# Patient Record
Sex: Female | Born: 1970
Health system: Southern US, Community
[De-identification: ages and names within clinical notes are randomized; demographics above are authoritative.]

## PROBLEM LIST (undated history)

## (undated) DIAGNOSIS — J45909 Unspecified asthma, uncomplicated: Secondary | ICD-10-CM

## (undated) DIAGNOSIS — E079 Disorder of thyroid, unspecified: Secondary | ICD-10-CM

## (undated) DIAGNOSIS — T7840XA Allergy, unspecified, initial encounter: Secondary | ICD-10-CM

## (undated) HISTORY — DX: Disorder of thyroid, unspecified: E07.9

## (undated) HISTORY — DX: Unspecified asthma, uncomplicated: J45.909

## (undated) HISTORY — DX: Allergy, unspecified, initial encounter: T78.40XA

---

## 2002-05-21 ENCOUNTER — Inpatient Hospital Stay (HOSPITAL_COMMUNITY): Admission: AD | Admit: 2002-05-21 | Discharge: 2002-05-21 | Payer: Self-pay | Admitting: Obstetrics and Gynecology

## 2002-05-21 ENCOUNTER — Encounter: Payer: Self-pay | Admitting: Obstetrics and Gynecology

## 2003-04-07 ENCOUNTER — Inpatient Hospital Stay (HOSPITAL_COMMUNITY): Admission: AD | Admit: 2003-04-07 | Discharge: 2003-04-07 | Payer: Self-pay | Admitting: *Deleted

## 2003-04-30 ENCOUNTER — Other Ambulatory Visit: Admission: RE | Admit: 2003-04-30 | Discharge: 2003-04-30 | Payer: Self-pay | Admitting: Obstetrics and Gynecology

## 2003-11-21 ENCOUNTER — Inpatient Hospital Stay (HOSPITAL_COMMUNITY): Admission: AD | Admit: 2003-11-21 | Discharge: 2003-11-24 | Payer: Self-pay | Admitting: Obstetrics and Gynecology

## 2004-01-01 ENCOUNTER — Other Ambulatory Visit: Admission: RE | Admit: 2004-01-01 | Discharge: 2004-01-01 | Payer: Self-pay | Admitting: Obstetrics and Gynecology

## 2011-07-22 ENCOUNTER — Ambulatory Visit (INDEPENDENT_AMBULATORY_CARE_PROVIDER_SITE_OTHER): Payer: BC Managed Care – PPO | Admitting: Family Medicine

## 2011-07-22 DIAGNOSIS — H1011 Acute atopic conjunctivitis, right eye: Secondary | ICD-10-CM

## 2011-07-22 DIAGNOSIS — J45909 Unspecified asthma, uncomplicated: Secondary | ICD-10-CM

## 2011-07-22 DIAGNOSIS — R059 Cough, unspecified: Secondary | ICD-10-CM

## 2011-07-22 DIAGNOSIS — R05 Cough: Secondary | ICD-10-CM

## 2011-07-22 DIAGNOSIS — R053 Chronic cough: Secondary | ICD-10-CM

## 2011-07-22 MED ORDER — FLUTICASONE PROPIONATE 50 MCG/ACT NA SUSP: 2.0000 | Freq: Every day | NASAL | Status: DC

## 2011-07-22 MED ORDER — ALBUTEROL SULFATE (2.5 MG/3ML) 0.083% IN NEBU: 2.5000 mg | INHALATION_SOLUTION | Freq: Four times a day (QID) | RESPIRATORY_TRACT | Status: DC | PRN

## 2011-07-22 MED ORDER — FLUTICASONE-SALMETEROL 250-50 MCG/DOSE IN AEPB: 1.0000 | INHALATION_SPRAY | Freq: Two times a day (BID) | RESPIRATORY_TRACT | Status: DC

## 2011-07-22 MED ORDER — ALBUTEROL SULFATE (2.5 MG/3ML) 0.083% IN NEBU
2.5000 mg | INHALATION_SOLUTION | Freq: Once | RESPIRATORY_TRACT | Status: AC
  Administered 2011-07-22: 2.5 mg via RESPIRATORY_TRACT

## 2011-07-22 MED ORDER — PREDNISONE 20 MG PO TABS: ORAL_TABLET | ORAL | Status: AC

## 2011-07-22 NOTE — Patient Instructions (Signed)
B?nh Suy?n ? Ng??i L?n (Asthma, Adult) B?nh suy?n l do ???ng d?n khng kh t?i ph?i b? thu h?p. B?nh ny c th? pht sinh do ph?n, b?i, lng th, ??t x?p, m?t s? th?c ph?m, nhi?m trng ???ng h h?p, ti?p xc v?i khi, t?p th? d?c, tnh tr?ng c?ng th?ng tinh th?n ho?c cc ch?t gy d? ?ng khc (nh?ng th? gy ph?n ?ng d? ?ng ho?c d? ?ng). Nh?ng c?n suy?n l?p l?i l ph? bi?n. H??NG D?N CH?M SC T?I NH  U?ng thu?c c k ??n theo yu c?u c?a bc s?.   Trnh ti?p xc v?i ph?n, b?i, lng ??ng v?t, ??t x?p, khi v nh?ng th? khc c th? gy ra nh?ng c?n hen suy?n ? nh v t?i n?i lm vi?c.   B?n c th? t b? nh?ng c?n suy?n h?n n?u b?n gi?m b?t b?i trong nh. My ht b?i khng kh b?ng t?nh ?i?n c th? gip b?n.   Thay g?i ho?c n?m b?ng nh?ng ch?t li?u t gy d? ?ng h?n c th? gip cho b?n.   Bo cho Bc s? bi?t k? ho?ch x? tr c?n suy?n t?i nh bao g?m c? vi?c s? d?ng d?ng c? ?o thng kh t?i ?a gip ?nh gi m?c ?? n?ng c?a c?n suy?n. K? ho?ch x? tr c th? gip gi?m ??n m?c t?i thi?u hay ch?m d?t c?n suy?n m khng c?n nh? ??n s? tr? gip v? y t?.   N?u khng b? h?n ch? s? d?ng n??c, hy u?ng 8 ??n 10 ly n??c m?i ngy.   Lun lun c k? ho?ch chu?n b? nh? ??n s? tr? gip v? y t? bao g?m c? vi?c g?i ?i?n tho?i cho Bc s?, ??n trung tm c?p c?u khu v?c, v g?i ?i?n tho?i s? 911 cho nh?ng c?n suy?n tr?m tr?ng.   Trao ??i v?i bc s? c?a b?n v? sinh ho?t t?p th? d?c ??u ??n m b?n c th? lm.   N?u nguyn nhn gy suy?n l lng th, c th? b?n s? ph?i ng?ng nui ??ng v?t trong nh.  HY THAM V?N V?I CHUYN GIA Y T? N?U:  B?n th? kh kh ho?c th? g?p ngay c? khi ? u?ng thu?c ?? phng ng?a nh?ng c?n suy?n.   B?n b? ?au c?, ?au ng?c ho?c ??m tr? nn ??c l?i.   ??m c?a b?n chuy?n t? mu trong ho?c tr?ng sang vng, xanh, xm ho?c c mu.   B?n c b?t k? tr?c tr?c no c th? lin quan ??n lo?i thu?c b?n ?ang u?ng (v d? nh? pht ban, ng?a, s?ng ho?c kh th?).  HY NGAY L?P T?C THAM V?N V?I CHUYN  GIA Y T? N?U:  Lo?i thu?c b?n th??ng dng khng lm b?n h?t th? kh kh, ho?c b?n ngy cng b? ho ho?c th? g?p nhi?u h?n.   B?n ngy cng kh th?.   B?n b? s?t.  HY CH?C CH?N R?NG B?N:  Hi?u r nh?ng h??ng d?n khi xu?t vi?n.   S? theo di tnh tr?ng b?nh c?a b?n.   S? ??n khm b?nh ngay l?p t?c nh? ? ???c h??ng d?n.  Document Released: 02/27/2005 Document Revised: 02/16/2011 Exit[employer] Patient Information 2012 [employer]. 

## 2011-07-22 NOTE — Progress Notes (Signed)
Subjective: 41 year old female he is female who does not speak much [place]. Her husband is translating for her today. She has a history of asthma and allergic rhinitis over the years. She previously gone to [facility] the last couple of years and has been on Advair but ran out of several weeks ago. The clinic and moved and she was unable to get a refill. She's been wheezing more. Chest nasal congestion and rhinorrhea. Her ears do not bother her. It has not been a problem. She does have chronic cough. She works in a [employer].  Objective: Pleasant lady in no acute distress at this time. TMs are normal. Throat clear. Nose dry congested. Chest had no rhonchi or rales has a soft wheeze diffusely throughout the lungs. Is more pronounced with expiration. Flow is not very good. Peak flow measured to 270 are predicted 400.  Assessment: Asthma Chronic cough Allergic rhinitis   Plan: See what she does nebulizer of albuterol and then decide treatment accordingly thank you  Repeat peak flow 330.

## 2011-07-24 ENCOUNTER — Telehealth: Payer: Self-pay

## 2011-07-24 DIAGNOSIS — R062 Wheezing: Secondary | ICD-10-CM

## 2011-07-24 NOTE — Telephone Encounter (Signed)
Discussed with Dr. Alwyn Ren.  Order should have been for albuterol inhaler.  Since the patient has already purchased the nebulizer solution, nebulizer for home use ordered.

## 2011-07-24 NOTE — Telephone Encounter (Signed)
PATIENT CAME IN WITH A BOX OF ALBUTEROL TO BE USED IN A NEBULIZER, HOWEVER WAS NOT GIVEN A PRESCRIPTION FOR A NEBULIZER MACHINE.  CAN WE WRITE A PRESCRIPTION FOR THIS?

## 2011-07-26 NOTE — Telephone Encounter (Signed)
Advised pt nebulizer order was sent.

## 2012-07-29 ENCOUNTER — Other Ambulatory Visit: Payer: Self-pay | Admitting: Family Medicine

## 2014-10-31 ENCOUNTER — Ambulatory Visit (INDEPENDENT_AMBULATORY_CARE_PROVIDER_SITE_OTHER): Payer: BLUE CROSS/BLUE SHIELD | Admitting: Family Medicine

## 2014-10-31 VITALS — BP 118/68 | HR 92 | Temp 98.3°F | Resp 16 | Ht 59.5 in | Wt 125.2 lb

## 2014-10-31 DIAGNOSIS — J45909 Unspecified asthma, uncomplicated: Secondary | ICD-10-CM

## 2014-10-31 DIAGNOSIS — J45901 Unspecified asthma with (acute) exacerbation: Secondary | ICD-10-CM

## 2014-10-31 DIAGNOSIS — R195 Other fecal abnormalities: Secondary | ICD-10-CM | POA: Diagnosis not present

## 2014-10-31 DIAGNOSIS — N926 Irregular menstruation, unspecified: Secondary | ICD-10-CM

## 2014-10-31 DIAGNOSIS — R062 Wheezing: Secondary | ICD-10-CM | POA: Diagnosis not present

## 2014-10-31 LAB — COMPLETE METABOLIC PANEL WITH GFR
ALT: 70 U/L — ABNORMAL HIGH (ref 6–29)
AST: 39 U/L — ABNORMAL HIGH (ref 10–30)
Albumin: 3.9 g/dL (ref 3.6–5.1)
Alkaline Phosphatase: 78 U/L (ref 33–115)
BUN: 12 mg/dL (ref 7–25)
CO2: 24 mmol/L (ref 20–31)
Calcium: 9.3 mg/dL (ref 8.6–10.2)
Chloride: 104 mmol/L (ref 98–110)
Creat: 0.51 mg/dL (ref 0.50–1.10)
GFR, Est African American: >89 mL/min (ref >=60)
GFR, Est Non African American: >89 mL/min (ref >=60)
Glucose, Bld: 94 mg/dL (ref 65–99)
Potassium: 3.9 mmol/L (ref 3.5–5.3)
Sodium: 138 mmol/L (ref 135–146)
Total Bilirubin: 0.5 mg/dL (ref 0.2–1.2)

## 2014-10-31 LAB — CBC
HCT: 36.5 % (ref 36.0–46.0)
Hemoglobin: 12.3 g/dL (ref 12.0–15.0)
MCH: 26.7 pg (ref 26.0–34.0)
MCHC: 33.7 g/dL (ref 30.0–36.0)
MCV: 79.2 fL (ref 78.0–100.0)
MPV: 8.9 fL (ref 8.6–12.4)
Platelets: 304 10*3/uL (ref 150–400)
RBC: 4.61 MIL/uL (ref 3.87–5.11)
RDW: 12.9 % (ref 11.5–15.5)
WBC: 6.2 10*3/uL (ref 4.0–10.5)

## 2014-10-31 LAB — COMPLETE METABOLIC PANEL WITHOUT GFR: Total Protein: 7 g/dL (ref 6.1–8.1)

## 2014-10-31 LAB — TSH: TSH: <0.008 u[IU]/mL — ABNORMAL LOW (ref 0.350–4.500)

## 2014-10-31 MED ORDER — ALBUTEROL SULFATE (2.5 MG/3ML) 0.083% IN NEBU
2.5000 mg | INHALATION_SOLUTION | Freq: Once | RESPIRATORY_TRACT | Status: AC
  Administered 2014-10-31: 2.5 mg via RESPIRATORY_TRACT

## 2014-10-31 MED ORDER — ALBUTEROL SULFATE HFA 108 (90 BASE) MCG/ACT IN AERS: 2.0000 | INHALATION_SPRAY | Freq: Four times a day (QID) | RESPIRATORY_TRACT | Status: DC | PRN

## 2014-10-31 MED ORDER — IPRATROPIUM BROMIDE 0.02 % IN SOLN
0.5000 mg | Freq: Once | RESPIRATORY_TRACT | Status: AC
  Administered 2014-10-31: 0.5 mg via RESPIRATORY_TRACT

## 2014-10-31 MED ORDER — FLUTICASONE-SALMETEROL 250-50 MCG/DOSE IN AEPB: 1.0000 | INHALATION_SPRAY | Freq: Two times a day (BID) | RESPIRATORY_TRACT | Status: DC

## 2014-10-31 NOTE — Progress Notes (Signed)
Chief Complaint:  Chief Complaint  Patient presents with  . Menstrual Problem    been more irregular periods  . Asthma    for a couple weeks--been increasing SOB  . Medication Refill    Advair  . Stool Concerns    HPI: Margaret Flynn is a 44 y.o. female who reports to [facility] today complaining of asthma sxs worsen without advair , she has allergies to certain perfumes, she is not using the advair regular, she uses it prn. She ahs seasonal allergies and worse in the srping. She has some sxs in the winter. She does use zyrtec during the spring. She has no food allergies. She did not have these sxs in [place], She has ahd these sxs for greater than 6 years. She has no SOb right now but has more wheezing sxs at night.   She has had menstrual irregularities, she used tohave it every month but she has had irregular periods for the last 2 months. No fevers, chill, nausea, vomting, back pain.  She has had 2-3 days pf her cycle. She currently has her cycle. She has not had a pap on a long time. She has never had an abnormal pap. Her oldest siter stopped her  Cycle at 49  She is planning to have a full phsycial in December. No thyroid sxs or   She has some loose stools for the last 2 weeks , 1-2 weeks. She is able to eat and rink ok, she has no nv/abd pain No food saffect her. She doe snot feel abd pain. No new travels and no new medications.  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 No blood in her diarrhea. She is only having it 1-2 time a week.   Past Medical History  Diagnosis Date  . Asthma    Past Surgical History  Procedure Laterality Date  . Cesarean section     Social History   Social History  . Marital Status: Married    Spouse Name: N/A  . Number of Children: N/A  . Years of Education: N/A   Social History Main Topics  . Smoking status: Never Smoker   . Smokeless tobacco: Never Used  . Alcohol Use: None  . Drug Use: None  .  Sexual Activity: Not Asked   Other Topics Concern  . None   Social History Narrative   History reviewed. No pertinent family history. No Known Allergies Prior to Admission medications   Medication Sig Start Date End Date Taking? Authorizing Provider  Fluticasone-Salmeterol (ADVAIR DISKUS) 250-50 MCG/DOSE AEPB Inhale 1 puff into the lungs 2 (two) times daily. PATIENT NEEDS OFFICE VISIT FOR ADDITIONAL REFILLS 07/29/12  Yes Morrell Riddle, PA-C  albuterol (PROVENTIL) (2.5 MG/3ML) 0.083% nebulizer solution Take 3 mLs (2.5 mg total) by nebulization every 6 (six) hours as needed for wheezing. 07/22/11 07/21/12  Peyton Najjar, MD  fluticasone [facility]) 50 MCG/ACT nasal spray Place 2 sprays into the nose daily. 07/22/11 07/21/12  Peyton Najjar, MD  Fluticasone-Salmeterol (ADVAIR) 250-50 MCG/DOSE AEPB Inhale 1 puff into the lungs every 12 (twelve) hours.    Historical Provider, MD     ROS: The patient denies fevers, chills, night sweats, unintentional weight loss, chest pain, palpitations, wheezing, dyspnea on exertion, nausea, vomiting, abdominal pain, dysuria, hematuria, melena, numbness, weakness, or tingling.   All other systems have been reviewed and were otherwise negative with the exception of those mentioned in  the HPI and as above.    PHYSICAL EXAM: Filed Vitals:   10/31/14 1116  BP: 118/68  Pulse: 92  Temp: 98.3 F (36.8 C)  Resp: 16   Body mass index is 24.87 kg/(m^2).   General: Alert, no acute distress HEENT:  Normocephalic, atraumatic, oropharynx patent. EOMI, PERRLA Cardiovascular:  Regular rate and rhythm, no rubs murmurs or gallops.  No Carotid bruits, radial pulse intact. No pedal edema.  Respiratory: Clear to auscultation bilaterally.  No wheezes, rales, or rhonchi.  No cyanosis, no use of accessory musculature Abdominal: No organomegaly, abdomen is soft and non-tender, positive bowel sounds. No masses. Skin: No rashes. Neurologic: Facial musculature  symmetric. Psychiatric: Patient acts appropriately throughout our interaction. Lymphatic: No cervical or submandibular lymphadenopathy, no thryodimegaly Musculoskeletal: Gait intact. No edema, tenderness GU-normal cervix, no masses lesion, she is on menses   LABS: No results found for this or any previous visit.   EKG/XRAY:   Primary read interpreted by Dr. Conley Rolls at [facility].   ASSESSMENT/PLAN: Encounter Diagnoses  Name Primary?  . Loose stools Yes  . Irregular periods   . Asthma with acute exacerbation, unspecified asthma severity   . Wheezing    Felt better after 1 neb treatment She is probably going through premenopauseal sxs Pap and labs pending Refill Advair Rx Albuterol prn Cont with zyrtec Monitor stool sxs  Gross sideeffects, risk and benefits, and alternatives of medications d/w patient. Patient is aware that all medications have potential sideeffects and we are unable to predict every sideeffect or drug-drug interaction that may occur.  Thao Le DO  10/31/2014 11:57 AM

## 2014-11-03 LAB — PAP IG, CT-NG, RFX HPV ASCU
Chlamydia Probe Amp: NEGATIVE
GC Probe Amp: NEGATIVE

## 2014-11-09 ENCOUNTER — Telehealth: Payer: Self-pay | Admitting: Family Medicine

## 2014-11-09 NOTE — Telephone Encounter (Signed)
LM to call me back about labs, will send leter as well. She will be referred to endocrinology.

## 2014-11-13 ENCOUNTER — Telehealth: Payer: Self-pay | Admitting: Family Medicine

## 2014-11-13 ENCOUNTER — Encounter: Payer: Self-pay | Admitting: Family Medicine

## 2014-11-13 DIAGNOSIS — R7989 Other specified abnormal findings of blood chemistry: Secondary | ICD-10-CM

## 2014-11-13 NOTE — Telephone Encounter (Signed)
LM to advise to return to office for repeat liver enzymes and hepatitis panel , also complete thyroid panel. I have already referred her to endo

## 2014-11-28 ENCOUNTER — Ambulatory Visit (INDEPENDENT_AMBULATORY_CARE_PROVIDER_SITE_OTHER): Payer: BLUE CROSS/BLUE SHIELD | Admitting: Physician Assistant

## 2014-11-28 VITALS — BP 130/70 | HR 94 | Temp 98.0°F | Resp 18 | Ht 59.0 in | Wt 124.2 lb

## 2014-11-28 DIAGNOSIS — R946 Abnormal results of thyroid function studies: Secondary | ICD-10-CM

## 2014-11-28 DIAGNOSIS — R7989 Other specified abnormal findings of blood chemistry: Secondary | ICD-10-CM

## 2014-11-28 DIAGNOSIS — E0590 Thyrotoxicosis, unspecified without thyrotoxic crisis or storm: Secondary | ICD-10-CM | POA: Diagnosis not present

## 2014-11-28 DIAGNOSIS — R748 Abnormal levels of other serum enzymes: Secondary | ICD-10-CM | POA: Diagnosis not present

## 2014-11-28 LAB — COMPREHENSIVE METABOLIC PANEL
ALT: 47 U/L — AB (ref 6–29)
AST: 33 U/L — AB (ref 10–30)
Albumin: 4.1 g/dL (ref 3.6–5.1)
Alkaline Phosphatase: 79 U/L (ref 33–115)
BILIRUBIN TOTAL: 0.5 mg/dL (ref 0.2–1.2)
BUN: 17 mg/dL (ref 7–25)
CO2: 25 mmol/L (ref 20–31)
CREATININE: 0.52 mg/dL (ref 0.50–1.10)
Calcium: 9.6 mg/dL (ref 8.6–10.2)
Chloride: 103 mmol/L (ref 98–110)
GLUCOSE: 118 mg/dL — AB (ref 65–99)
Potassium: 4 mmol/L (ref 3.5–5.3)
SODIUM: 141 mmol/L (ref 135–146)
Total Protein: 7.4 g/dL (ref 6.1–8.1)

## 2014-11-28 LAB — TSH: TSH: <0.008 u[IU]/mL — ABNORMAL LOW (ref 0.350–4.500)

## 2014-11-28 LAB — T3, FREE: T3 FREE: 9 pg/mL — AB (ref 2.3–4.2)

## 2014-11-28 LAB — HEPATITIS B SURFACE ANTIGEN: Hepatitis B Surface Ag: NEGATIVE

## 2014-11-28 LAB — T4, FREE: Free T4: 2.43 ng/dL — ABNORMAL HIGH (ref 0.80–1.80)

## 2014-11-28 LAB — HEPATITIS B CORE ANTIBODY, IGM: HEP B C IGM: NONREACTIVE

## 2014-11-28 LAB — HEPATITIS B SURFACE ANTIBODY, QUANTITATIVE: Hepatitis B-Post: 0 m[IU]/mL

## 2014-11-28 LAB — HEPATITIS C ANTIBODY: HCV AB: NEGATIVE

## 2014-11-28 NOTE — Patient Instructions (Signed)
We rechecked several labs today to look at your liver and your thyroid.  We will let you know these results. It looks like your endocrinology appointment is already scheduled: Dr Talmage Nap at [facility]. 12/24/14 at 9:00am [address] [place], [place] [address] [phone]   Hyperthyroidism The thyroid is a large gland located in the lower front part of your neck. The thyroid helps control metabolism. Metabolism is how your body uses food. It controls metabolism with the hormone thyroxine. When the thyroid is overactive, it produces too much hormone. When this happens, these following problems may occur:   Nervousness  Heat intolerance  Weight loss (in spite of increase food intake)  Diarrhea  Change in hair or skin texture  Palpitations (heart skipping or having extra beats)  Tachycardia (rapid heart rate)  Loss of menstruation (amenorrhea)  Shaking of the hands CAUSES  Grave's Disease (the immune system attacks the thyroid gland). This is the most common cause.  Inflammation of the thyroid gland.  Tumor (usually benign) in the thyroid gland or elsewhere.  Excessive use of thyroid medications (both prescription and 'natural').  Excessive ingestion of Iodine. DIAGNOSIS  To prove hyperthyroidism, your caregiver may do blood tests and ultrasound tests. Sometimes the signs are hidden. It may be necessary for your caregiver to watch this illness with blood tests, either before or after diagnosis and treatment. TREATMENT Short-term treatment There are several treatments to control symptoms. Drugs called beta blockers may give some relief. Drugs that decrease hormone production will provide temporary relief in many people. These measures will usually not give permanent relief. Definitive therapy There are treatments available which can be discussed between you and your caregiver which will permanently treat the problem. These treatments range from surgery  (removal of the thyroid), to the use of radioactive iodine (destroys the thyroid by radiation), to the use of antithyroid drugs (interfere with hormone synthesis). The first two treatments are permanent and usually successful. They most often require hormone replacement therapy for life. This is because it is impossible to remove or destroy the exact amount of thyroid required to make a person euthyroid (normal). HOME CARE INSTRUCTIONS  See your caregiver if the problems you are being treated for get worse. Examples of this would be the problems listed above. SEEK MEDICAL CARE IF: Your general condition worsens. MAKE SURE YOU:   Understand these instructions.  Will watch your condition.  Will get help right away if you are not doing well or get worse. Document Released: 02/27/2005 Document Revised: 05/22/2011 Document Reviewed: 07/11/2006 [facility] Patient Information 2015 [place], [place]. This information is not intended to replace advice given to you by your health care provider. Make sure you discuss any questions you have with your health care provider.

## 2014-11-28 NOTE — Progress Notes (Signed)
   Subjective:    Patient ID: Margaret Flynn, female    DOB: 01-Oct-1970, 44 y.o.   MRN: [id number]  Chief Complaint  Patient presents with  . Follow-up    recheck labs (received letter)   Medications, allergies, past medical history, surgical history, family history, social history and problem list reviewed and updated.  HPI  44 yof presents for recheck labs.  Seen here last month for loose stools. Labs at that time showed elevated lfts and low tsh. Returns today for recheck. Denies diarrhea, fevers, chills, abd pain, RUQ pain, weight gain/loss, trouble sleeping.   Review of Systems See HPI     Objective:   Physical Exam  Constitutional: She is oriented to person, place, and time. She appears well-developed and well-nourished.  Non-toxic appearance. She does not have a sickly appearance. She does not appear ill. No distress.  BP 130/70 mmHg  Pulse 94  Temp(Src) 98 F (36.7 C) (Oral)  Resp 18  Ht  (1.499 m)  Wt 124 lb 4 oz (56.359 kg)  BMI 25.08 kg/m2  SpO2 98%  LMP 10/28/2014   Neurological: She is alert and oriented to person, place, and time.      Assessment & Plan:   Elevated liver enzymes - Plan: Comprehensive metabolic panel, Hepatitis B surface antibody, Hepatitis B surface antigen, Hepatitis C antibody, Hepatitis B core antibody, IgM  Abnormal thyroid blood test - Plan: TSH, T4, Free, T3, Free  Hyperthyroidism --rechecking labs today, will f/u results --pt has already been referred to dr balan next month for possible hyperthyroidism  Donnajean Lopes, PA-C Physician Assistant-Certified [facility]  11/28/2014 3:48 PM

## 2014-12-21 ENCOUNTER — Encounter: Payer: Self-pay | Admitting: Family Medicine

## 2015-01-20 ENCOUNTER — Other Ambulatory Visit (HOSPITAL_COMMUNITY): Payer: Self-pay | Admitting: Endocrinology

## 2015-01-20 DIAGNOSIS — E0590 Thyrotoxicosis, unspecified without thyrotoxic crisis or storm: Secondary | ICD-10-CM

## 2015-01-25 ENCOUNTER — Encounter (HOSPITAL_COMMUNITY)
Admission: RE | Admit: 2015-01-25 | Discharge: 2015-01-25 | Disposition: A | Discharge status: Home / Self Care | Payer: BLUE CROSS/BLUE SHIELD | Source: Ambulatory Visit | Attending: Endocrinology | Admitting: Endocrinology

## 2015-01-25 ENCOUNTER — Other Ambulatory Visit (HOSPITAL_COMMUNITY): Payer: Self-pay | Admitting: Endocrinology

## 2015-01-25 DIAGNOSIS — E0590 Thyrotoxicosis, unspecified without thyrotoxic crisis or storm: Secondary | ICD-10-CM

## 2015-01-26 ENCOUNTER — Encounter (HOSPITAL_COMMUNITY)
Admission: RE | Admit: 2015-01-26 | Discharge: 2015-01-26 | Disposition: A | Discharge status: Home / Self Care | Payer: BLUE CROSS/BLUE SHIELD | Source: Ambulatory Visit | Attending: Endocrinology | Admitting: Endocrinology

## 2015-01-26 DIAGNOSIS — E0590 Thyrotoxicosis, unspecified without thyrotoxic crisis or storm: Secondary | ICD-10-CM

## 2015-01-26 MED ORDER — SODIUM PERTECHNETATE TC 99M INJECTION: 10.0000 | Freq: Once | INTRAVENOUS | Status: DC | PRN

## 2015-01-28 ENCOUNTER — Other Ambulatory Visit (HOSPITAL_COMMUNITY): Payer: Self-pay | Admitting: Endocrinology

## 2015-01-28 DIAGNOSIS — E0590 Thyrotoxicosis, unspecified without thyrotoxic crisis or storm: Secondary | ICD-10-CM

## 2015-02-12 ENCOUNTER — Encounter (HOSPITAL_COMMUNITY)
Admission: RE | Admit: 2015-02-12 | Discharge: 2015-02-12 | Disposition: A | Discharge status: Home / Self Care | Payer: BLUE CROSS/BLUE SHIELD | Source: Ambulatory Visit | Attending: Endocrinology | Admitting: Endocrinology

## 2015-02-12 DIAGNOSIS — E0590 Thyrotoxicosis, unspecified without thyrotoxic crisis or storm: Secondary | ICD-10-CM

## 2015-02-12 LAB — HCG, SERUM, QUALITATIVE: Preg, Serum: NEGATIVE

## 2015-02-12 MED ORDER — SODIUM IODIDE I 131 CAPSULE: 14.0000 | Freq: Once | INTRAVENOUS | Status: DC | PRN

## 2015-02-18 ENCOUNTER — Encounter: Payer: Self-pay | Admitting: Family Medicine

## 2015-02-19 ENCOUNTER — Encounter: Payer: Self-pay | Admitting: Family Medicine

## 2015-07-29 ENCOUNTER — Encounter: Payer: BLUE CROSS/BLUE SHIELD | Admitting: Family Medicine

## 2015-11-01 LAB — TSH: TSH: 0.22 u[IU]/mL — AB (ref <=5.90)

## 2015-11-04 ENCOUNTER — Encounter: Payer: BLUE CROSS/BLUE SHIELD | Admitting: Family Medicine

## 2015-11-10 DIAGNOSIS — J45909 Unspecified asthma, uncomplicated: Secondary | ICD-10-CM | POA: Insufficient documentation

## 2015-11-10 NOTE — Progress Notes (Signed)
Subjective:    Patient ID: Margaret Flynn, female    DOB: 18-Sep-1970, 45 y.o.   MRN: [id number] Chief Complaint  Patient presents with  . Annual Exam    CPE    HPI  Margaret Flynn is a 45 yo woman here today for a wellness exam.  This is my first time meeting Margaret Flynn.  [facility] 1 yr prior and she has not had a complete physical in [facility] prior.  Primary prevention screening: Cervical cancer: Had nml pap smear 1 yr prior, HR HPV screen not done. No h/o abnml pap prior. Breast cancer:  Thinks she has had one prior but not sure.  Is reluctant to proceed with mammogram but ultimately willing.  No family history. Cardiovascular risk factors: No lipid panel prior. Vitamins/Supplements/OTC meds: None. Immunizations:  Does not remember ever having a tetanus shot prior but is willing to do today.  Does get her flu shot every year at her job. Diet/Exercise: Has started trying to walk.  Tries to eat healthy. Dentist: does go occ Optho: Has glasses but has not been in several years.   Chronic medical conditions:  Asthma: On Advair, worsened last yr when tried off.  Exacerbation with seasonal allergies and strong odors.  Hyperthyroidism: Followed by Dr. Talmage NapBalan, has RAI therapy this past Dev (9 mos prior).  Elevated LFTs:  Tested neg for Hep B and Hep C.  Is NOT hep B immune.  She declines starting the vaccination series today.  C/o of an itchy rash above her ankles that is worse when she is outside walking for the past mo - has tried an unknown otc product w/o relief.  Has been keeping well moisturized  Past Medical History:  Diagnosis Date  . Asthma    Past Surgical History:  Procedure Laterality Date  . CESAREAN SECTION     Current Outpatient Prescriptions on File Prior to Visit  Medication Sig Dispense Refill  . albuterol (PROVENTIL HFA;VENTOLIN HFA) 108 (90 BASE) MCG/ACT inhaler Inhale 2 puffs into the lungs every 6 (six) hours as needed for wheezing or shortness of breath.  (Patient not taking: Reported on 11/11/2015) 1 Inhaler 11   No current facility-administered medications on file prior to visit.    No Known Allergies No family history on file. Social History   Social History  . Marital status: Married    Spouse name: N/A  . Number of children: N/A  . Years of education: N/A   Social History Main Topics  . Smoking status: Never Smoker  . Smokeless tobacco: Never Used  . Alcohol use None  . Drug use: Unknown  . Sexual activity: Not Asked   Other Topics Concern  . None   Social History Narrative  . None    [facility] 11/11/2015 10/31/2014 10/31/2014  Decreased Interest 0 0 0  Down, Depressed, Hopeless 0 0 0  PHQ - 2 Score 0 0 0     Review of Systems  All other systems reviewed and are negative.      Objective:   Physical Exam  Constitutional: She is oriented to person, place, and time. She appears well-developed and well-nourished. No distress.  HENT:  Head: Normocephalic and atraumatic.  Right Ear: Tympanic membrane, external ear and ear canal normal.  Left Ear: Tympanic membrane, external ear and ear canal normal.  Nose: Mucosal edema present. No rhinorrhea.  Mouth/Throat: Uvula is midline, oropharynx is clear and moist and mucous membranes are normal.  No posterior oropharyngeal erythema.  Eyes: Conjunctivae and EOM are normal. Pupils are equal, round, and reactive to light. Right eye exhibits no discharge. Left eye exhibits no discharge. No scleral icterus.  Neck: Normal range of motion. Neck supple. No thyromegaly present.  Cardiovascular: Normal rate, regular rhythm, normal heart sounds and intact distal pulses.   Pulmonary/Chest: Effort normal and breath sounds normal. No respiratory distress.  Abdominal: Soft. Bowel sounds are normal. There is no tenderness.  Genitourinary: Vagina normal and uterus normal. No breast swelling, tenderness, discharge or bleeding. Cervix exhibits no motion tenderness and no  friability. Right adnexum displays no mass and no tenderness. Left adnexum displays no mass and no tenderness.  Musculoskeletal: She exhibits no edema.  Lymphadenopathy:    She has no cervical adenopathy.  Neurological: She is alert and oriented to person, place, and time. She has normal reflexes.  Skin: Skin is warm and dry. She is not diaphoretic. No erythema.  Psychiatric: She has a normal mood and affect. Her behavior is normal.     BP 110/70   Pulse 79   Temp 97.9 F (36.6 C) (Oral)   Resp 18   Ht 4\' 11"  (1.499 m)   Wt 120 lb 3.2 oz (54.5 kg)   LMP 09/14/2015   SpO2 100%   BMI 24.28 kg/m        Visual Acuity Screening   Right eye Left eye Both eyes  Without correction: 20/100 20/70 20/70  With correction:        Assessment & Plan:  Needs imm: tdap done today, flu shot annually at work, hep B Does not need pap until 10/2017 but repeated today as she has NOT had many reg pap smears thoughout life though no h/o abnml Referred for mammogram - pt reluctant but begrudgingly agrees  1. Annual physical exam   2. Routine screening for STI (sexually transmitted infection)   3. Screening for breast cancer   4. Screening for cardiovascular, respiratory, and genitourinary diseases   5. Screening for cervical cancer   6. Screening for deficiency anemia   7. Hyperthyroidism   8. Asthma, unspecified asthma severity, uncomplicated   9. Elevated LFTs   10. Need for prophylactic vaccination with combined diphtheria-tetanus-pertussis (DTP) vaccine   11. Contact dermatitis    Reminded pt to go to eye doctor - def needs glasses - likely causing HAs  Orders Placed This Encounter  Procedures  . MM Digital Screening    Standing Status:   Future    Standing Expiration Date:   01/09/2017    Order Specific Question:   Reason for Exam (SYMPTOM  OR DIAGNOSIS REQUIRED)    Answer:   screening    Order Specific Question:   Is the patient pregnant?    Answer:   No    Order Specific  Question:   Preferred imaging location?    Answer:   [facility]  . Tdap vaccine greater than or equal to 7yo IM  . HIV antibody  . Comprehensive metabolic panel    Order Specific Question:   Has the patient fasted?    Answer:   Yes  . CBC  . Lipid panel    Order Specific Question:   Has the patient fasted?    Answer:   Yes  . Care order/instruction:    AVS - please print after vaccine is given and GO!  Marland Kitchen POCT urinalysis dipstick    Meds ordered this encounter  Medications  . levothyroxine (SYNTHROID, LEVOTHROID)  75 MCG tablet    Sig: Take 75 mcg by mouth daily before breakfast.  . Fluticasone-Salmeterol (ADVAIR DISKUS) 250-50 MCG/DOSE AEPB    Sig: Inhale 1 puff into the lungs 2 (two) times daily. Gargle with water and spit after each inhalation    Dispense:  1 each    Refill:  12    Please hold on file for whenever pt needs it.  . triamcinolone cream (KENALOG) 0.1 %    Sig: Apply 1 application topically 2 (two) times daily as needed. For itching allergic rash    Dispense:  80 g    Refill:  0     Norberto Sorenson, M.D.  [facility] [address] [place], [place] [address] [phone] phone [phone] fax  11/15/15 10:25 PM

## 2015-11-11 ENCOUNTER — Ambulatory Visit (INDEPENDENT_AMBULATORY_CARE_PROVIDER_SITE_OTHER): Payer: BLUE CROSS/BLUE SHIELD | Admitting: Family Medicine

## 2015-11-11 ENCOUNTER — Encounter: Payer: Self-pay | Admitting: Family Medicine

## 2015-11-11 VITALS — BP 110/70 | HR 79 | Temp 97.9°F | Resp 18 | Ht 59.0 in | Wt 120.2 lb

## 2015-11-11 DIAGNOSIS — Z1239 Encounter for other screening for malignant neoplasm of breast: Secondary | ICD-10-CM | POA: Diagnosis not present

## 2015-11-11 DIAGNOSIS — E0590 Thyrotoxicosis, unspecified without thyrotoxic crisis or storm: Secondary | ICD-10-CM

## 2015-11-11 DIAGNOSIS — Z0000 Encounter for general adult medical examination without abnormal findings: Secondary | ICD-10-CM | POA: Diagnosis not present

## 2015-11-11 DIAGNOSIS — Z130 Encounter for screening for diseases of the blood and blood-forming organs and certain disorders involving the immune mechanism: Secondary | ICD-10-CM

## 2015-11-11 DIAGNOSIS — Z1383 Encounter for screening for respiratory disorder NEC: Secondary | ICD-10-CM

## 2015-11-11 DIAGNOSIS — Z136 Encounter for screening for cardiovascular disorders: Secondary | ICD-10-CM

## 2015-11-11 DIAGNOSIS — Z1389 Encounter for screening for other disorder: Secondary | ICD-10-CM

## 2015-11-11 DIAGNOSIS — Z23 Encounter for immunization: Secondary | ICD-10-CM | POA: Diagnosis not present

## 2015-11-11 DIAGNOSIS — Z124 Encounter for screening for malignant neoplasm of cervix: Secondary | ICD-10-CM

## 2015-11-11 DIAGNOSIS — L259 Unspecified contact dermatitis, unspecified cause: Secondary | ICD-10-CM | POA: Diagnosis not present

## 2015-11-11 DIAGNOSIS — R945 Abnormal results of liver function studies: Secondary | ICD-10-CM

## 2015-11-11 DIAGNOSIS — R7989 Other specified abnormal findings of blood chemistry: Secondary | ICD-10-CM

## 2015-11-11 DIAGNOSIS — Z113 Encounter for screening for infections with a predominantly sexual mode of transmission: Secondary | ICD-10-CM

## 2015-11-11 DIAGNOSIS — J45909 Unspecified asthma, uncomplicated: Secondary | ICD-10-CM

## 2015-11-11 LAB — POCT URINALYSIS DIP (MANUAL ENTRY)
Bilirubin, UA: NEGATIVE
GLUCOSE UA: NEGATIVE
Ketones, POC UA: NEGATIVE
Leukocytes, UA: NEGATIVE
NITRITE UA: NEGATIVE
Protein Ur, POC: NEGATIVE
Spec Grav, UA: 1.01
UROBILINOGEN UA: 0.2
pH, UA: 7

## 2015-11-11 LAB — COMPREHENSIVE METABOLIC PANEL
ALBUMIN: 4.3 g/dL (ref 3.6–5.1)
ALT: 20 U/L (ref 6–29)
AST: 19 U/L (ref 10–35)
Alkaline Phosphatase: 101 U/L (ref 33–115)
BILIRUBIN TOTAL: 0.6 mg/dL (ref 0.2–1.2)
BUN: 12 mg/dL (ref 7–25)
CO2: 29 mmol/L (ref 20–31)
CREATININE: 0.63 mg/dL (ref 0.50–1.10)
Calcium: 9.7 mg/dL (ref 8.6–10.2)
Chloride: 103 mmol/L (ref 98–110)
Glucose, Bld: 93 mg/dL (ref 65–99)
Potassium: 5.1 mmol/L (ref 3.5–5.3)
SODIUM: 139 mmol/L (ref 135–146)
TOTAL PROTEIN: 7.9 g/dL (ref 6.1–8.1)

## 2015-11-11 LAB — LIPID PANEL
Cholesterol: 221 mg/dL — ABNORMAL HIGH (ref 125–200)
HDL: 79 mg/dL (ref >=46)
LDL Cholesterol: 122 mg/dL (ref <130)
Total CHOL/HDL Ratio: 2.8 Ratio (ref <=5.0)
Triglycerides: 100 mg/dL (ref <150)
VLDL: 20 mg/dL (ref <30)

## 2015-11-11 LAB — CBC
HCT: 38.7 % (ref 35.0–45.0)
Hemoglobin: 12.8 g/dL (ref 11.7–15.5)
MCH: 27.4 pg (ref 27.0–33.0)
MCHC: 33.1 g/dL (ref 32.0–36.0)
MCV: 82.9 fL (ref 80.0–100.0)
MPV: 8.6 fL (ref 7.5–12.5)
PLATELETS: 285 10*3/uL (ref 140–400)
RBC: 4.67 MIL/uL (ref 3.80–5.10)
RDW: 13.4 % (ref 11.0–15.0)
WBC: 8.1 10*3/uL (ref 3.8–10.8)

## 2015-11-11 LAB — HIV ANTIBODY (ROUTINE TESTING W REFLEX): HIV 1&2 Ab, 4th Generation: NONREACTIVE

## 2015-11-11 MED ORDER — TRIAMCINOLONE ACETONIDE 0.1 % EX CREA: 1.0000 "application " | TOPICAL_CREAM | Freq: Two times a day (BID) | CUTANEOUS | 0 refills | Status: DC | PRN

## 2015-11-11 MED ORDER — FLUTICASONE-SALMETEROL 250-50 MCG/DOSE IN AEPB: 1.0000 | INHALATION_SPRAY | Freq: Two times a day (BID) | RESPIRATORY_TRACT | 12 refills | Status: DC

## 2015-11-11 NOTE — Patient Instructions (Addendum)
Meds ordered this encounter  Medications  . levothyroxine (SYNTHROID, LEVOTHROID) 75 MCG tablet    Sig: Take 75 mcg by mouth daily before breakfast.  . Fluticasone-Salmeterol (ADVAIR DISKUS) 250-50 MCG/DOSE AEPB    Sig: Inhale 1 puff into the lungs 2 (two) times daily. Gargle with water and spit after each inhalation    Dispense:  1 each    Refill:  12    Please hold on file for whenever pt needs it.  . triamcinolone cream (KENALOG) 0.1 %    Sig: Apply 1 application topically 2 (two) times daily as needed. For itching allergic rash    Dispense:  80 g    Refill:  0   YOUR VISION IS HORRENDOUS - THIS CAN CAUSE A LOT OF EYE STRAIN AND HEADACHES.  YOU NEED TO GO THE EYE DOCTOR AND WEAR YOUR GLASSES.   IF you received an x-ray today, you will receive an invoice from [facility]. Please contact [facility] at [phone] with questions or concerns regarding your invoice.   IF you received labwork today, you will receive an invoice from [employer]. Please contact [facility] at [phone] with questions or concerns regarding your invoice.   Our billing staff will not be able to assist you with questions regarding bills from these companies.  You will be contacted with the lab results as soon as they are available. The fastest way to get your results is to activate your My Chart account. Instructions are located on the last page of this paperwork. If you have not heard from [place] regarding the results in 2 weeks, please contact this office.   Health Maintenance, Female Adopting a healthy lifestyle and getting preventive care can go a long way to promote health and wellness. Talk with your health care provider about what schedule of regular examinations is right for you. This is a good chance for you to check in with your provider about disease prevention and staying healthy. In between checkups, there are plenty of things you can do on your own. Experts  have done a lot of research about which lifestyle changes and preventive measures are most likely to keep you healthy. Ask your health care provider for more information. WEIGHT AND DIET  Eat a healthy diet  Be sure to include plenty of vegetables, fruits, low-fat dairy products, and lean protein.  Do not eat a lot of foods high in solid fats, added sugars, or salt.  Get regular exercise. This is one of the most important things you can do for your health.  Most adults should exercise for at least 150 minutes each week. The exercise should increase your heart rate and make you sweat (moderate-intensity exercise).  Most adults should also do strengthening exercises at least twice a week. This is in addition to the moderate-intensity exercise.  Maintain a healthy weight  Body mass index (BMI) is a measurement that can be used to identify possible weight problems. It estimates body fat based on height and weight. Your health care provider can help determine your BMI and help you achieve or maintain a healthy weight.  For females 34 years of age and older:   A BMI below 18.5 is considered underweight.  A BMI of 18.5 to 24.9 is normal.  A BMI of 25 to 29.9 is considered overweight.  A BMI of 30 and above is considered obese.  Watch levels of cholesterol and blood lipids  You should start having your blood tested for lipids  and cholesterol at 45 years of age, then have this test every 5 years.  You may need to have your cholesterol levels checked more often if:  Your lipid or cholesterol levels are high.  You are older than 45 years of age.  You are at high risk for heart disease.  CANCER SCREENING   Lung Cancer  Lung cancer screening is recommended for adults 50-66 years old who are at high risk for lung cancer because of a history of smoking.  A yearly low-dose CT scan of the lungs is recommended for people who:  Currently smoke.  Have quit within the past 15  years.  Have at least a 30-pack-year history of smoking. A pack year is smoking an average of one pack of cigarettes a day for 1 year.  Yearly screening should continue until it has been 15 years since you quit.  Yearly screening should stop if you develop a health problem that would prevent you from having lung cancer treatment.  Breast Cancer  Practice breast self-awareness. This means understanding how your breasts normally appear and feel.  It also means doing regular breast self-exams. Let your health care provider know about any changes, no matter how small.  If you are in your 20s or 30s, you should have a clinical breast exam (CBE) by a health care provider every 1-3 years as part of a regular health exam.  If you are 11 or older, have a CBE every year. Also consider having a breast X-ray (mammogram) every year.  If you have a family history of breast cancer, talk to your health care provider about genetic screening.  If you are at high risk for breast cancer, talk to your health care provider about having an MRI and a mammogram every year.  Breast cancer gene (BRCA) assessment is recommended for women who have family members with BRCA-related cancers. BRCA-related cancers include:  Breast.  Ovarian.  Tubal.  Peritoneal cancers.  Results of the assessment will determine the need for genetic counseling and BRCA1 and BRCA2 testing. Cervical Cancer Your health care provider may recommend that you be screened regularly for cancer of the pelvic organs (ovaries, uterus, and vagina). This screening involves a pelvic examination, including checking for microscopic changes to the surface of your cervix (Pap test). You may be encouraged to have this screening done every 3 years, beginning at age 60.  For women ages 61-65, health care providers may recommend pelvic exams and Pap testing every 3 years, or they may recommend the Pap and pelvic exam, combined with testing for human  papilloma virus (HPV), every 5 years. Some types of HPV increase your risk of cervical cancer. Testing for HPV may also be done on women of any age with unclear Pap test results.  Other health care providers may not recommend any screening for nonpregnant women who are considered low risk for pelvic cancer and who do not have symptoms. Ask your health care provider if a screening pelvic exam is right for you.  If you have had past treatment for cervical cancer or a condition that could lead to cancer, you need Pap tests and screening for cancer for at least 20 years after your treatment. If Pap tests have been discontinued, your risk factors (such as having a new sexual partner) need to be reassessed to determine if screening should resume. Some women have medical problems that increase the chance of getting cervical cancer. In these cases, your health care provider may recommend more  frequent screening and Pap tests. Colorectal Cancer  This type of cancer can be detected and often prevented.  Routine colorectal cancer screening usually begins at 45 years of age and continues through 45 years of age.  Your health care provider may recommend screening at an earlier age if you have risk factors for colon cancer.  Your health care provider may also recommend using home test kits to check for hidden blood in the stool.  A small camera at the end of a tube can be used to examine your colon directly (sigmoidoscopy or colonoscopy). This is done to check for the earliest forms of colorectal cancer.  Routine screening usually begins at age 65.  Direct examination of the colon should be repeated every 5-10 years through 45 years of age. However, you may need to be screened more often if early forms of precancerous polyps or small growths are found. Skin Cancer  Check your skin from head to toe regularly.  Tell your health care provider about any new moles or changes in moles, especially if there is a  change in a mole's shape or color.  Also tell your health care provider if you have a mole that is larger than the size of a pencil eraser.  Always use sunscreen. Apply sunscreen liberally and repeatedly throughout the day.  Protect yourself by wearing long sleeves, pants, a wide-brimmed hat, and sunglasses whenever you are outside. HEART DISEASE, DIABETES, AND HIGH BLOOD PRESSURE   High blood pressure causes heart disease and increases the risk of stroke. High blood pressure is more likely to develop in:  People who have blood pressure in the high end of the normal range (130-139/85-89 mm Hg).  People who are overweight or obese.  People who are African American.  If you are 87-21 years of age, have your blood pressure checked every 3-5 years. If you are 93 years of age or older, have your blood pressure checked every year. You should have your blood pressure measured twice--once when you are at a hospital or clinic, and once when you are not at a hospital or clinic. Record the average of the two measurements. To check your blood pressure when you are not at a hospital or clinic, you can use:  An automated blood pressure machine at a pharmacy.  A home blood pressure monitor.  If you are between 57 years and 83 years old, ask your health care provider if you should take aspirin to prevent strokes.  Have regular diabetes screenings. This involves taking a blood sample to check your fasting blood sugar level.  If you are at a normal weight and have a low risk for diabetes, have this test once every three years after 45 years of age.  If you are overweight and have a high risk for diabetes, consider being tested at a younger age or more often. PREVENTING INFECTION  Hepatitis B  If you have a higher risk for hepatitis B, you should be screened for this virus. You are considered at high risk for hepatitis B if:  You were born in a country where hepatitis B is common. Ask your health  care provider which countries are considered high risk.  Your parents were born in a high-risk country, and you have not been immunized against hepatitis B (hepatitis B vaccine).  You have HIV or AIDS.  You use needles to inject street drugs.  You live with someone who has hepatitis B.  You have had sex with someone  who has hepatitis B.  You get hemodialysis treatment.  You take certain medicines for conditions, including cancer, organ transplantation, and autoimmune conditions. Hepatitis C  Blood testing is recommended for:  Everyone born from [birth year] through 1965.  Anyone with known risk factors for hepatitis C. Sexually transmitted infections (STIs)  You should be screened for sexually transmitted infections (STIs) including gonorrhea and chlamydia if:  You are sexually active and are younger than 45 years of age.  You are older than 45 years of age and your health care provider tells you that you are at risk for this type of infection.  Your sexual activity has changed since you were last screened and you are at an increased risk for chlamydia or gonorrhea. Ask your health care provider if you are at risk.  If you do not have HIV, but are at risk, it may be recommended that you take a prescription medicine daily to prevent HIV infection. This is called pre-exposure prophylaxis (PrEP). You are considered at risk if:  You are sexually active and do not regularly use condoms or know the HIV status of your partner(s).  You take drugs by injection.  You are sexually active with a partner who has HIV. Talk with your health care provider about whether you are at high risk of being infected with HIV. If you choose to begin PrEP, you should first be tested for HIV. You should then be tested every 3 months for as long as you are taking PrEP.  PREGNANCY   If you are premenopausal and you may become pregnant, ask your health care provider about preconception counseling.  If you may  become pregnant, take 400 to 800 micrograms (mcg) of folic acid every day.  If you want to prevent pregnancy, talk to your health care provider about birth control (contraception). OSTEOPOROSIS AND MENOPAUSE   Osteoporosis is a disease in which the bones lose minerals and strength with aging. This can result in serious bone fractures. Your risk for osteoporosis can be identified using a bone density scan.  If you are 83 years of age or older, or if you are at risk for osteoporosis and fractures, ask your health care provider if you should be screened.  Ask your health care provider whether you should take a calcium or vitamin D supplement to lower your risk for osteoporosis.  Menopause may have certain physical symptoms and risks.  Hormone replacement therapy may reduce some of these symptoms and risks. Talk to your health care provider about whether hormone replacement therapy is right for you.  HOME CARE INSTRUCTIONS   Schedule regular health, dental, and eye exams.  Stay current with your immunizations.   Do not use any tobacco products including cigarettes, chewing tobacco, or electronic cigarettes.  If you are pregnant, do not drink alcohol.  If you are breastfeeding, limit how much and how often you drink alcohol.  Limit alcohol intake to no more than 1 drink per day for nonpregnant women. One drink equals 12 ounces of beer, 5 ounces of wine, or 1 ounces of hard liquor.  Do not use street drugs.  Do not share needles.  Ask your health care provider for help if you need support or information about quitting drugs.  Tell your health care provider if you often feel depressed.  Tell your health care provider if you have ever been abused or do not feel safe at home.   This information is not intended to replace advice given to you  by your health care provider. Make sure you discuss any questions you have with your health care provider.   Document Released: 09/12/2010  Document Revised: 03/20/2014 Document Reviewed: 01/29/2013 Elsevier Interactive Patient Education 2016 [place] on a Budget There are many ways to save money at the grocery store and continue to eat healthy. You can be successful if you plan your meals according to your budget, purchase according to your budget and grocery list, and prepare food yourself.  How can I buy more food on a limited budget? Plan  Plan meals and snacks according to a grocery list and budget you create.  Look for recipes where you can cook once and make enough food for two meals.  Include meals that will "stretch" more expensive foods such as stews, casseroles, and stir-fry dishes.  Make a grocery list and make sure to bring it with you to the store. If you have a smart phone, you could use your phone to create your shopping list. Purchase  When grocery shopping, buy only the items on your grocery list and go only to the areas of the store that have the items on your list. Prepare  Some meal items can be prepared in advance. Pre-cook on days when you have extra time.  Make extra food (such as by doubling recipes) and freeze the extras in meal-sized containers or in individual portions for fast meals and snacks.  Use leftovers in your meal plan for the week.  Try some meatless meals or try "no cook" meals like salads.  When you come home from the grocery store, wash and prepare your fruits and vegetables so they are ready to use and eat. This will help reduce food waste. How can I buy more food on a limited budget?  Try these tips the next time you go shopping:   [place] store brands or generic brands.  Use coupons only for foods and brands you normally buy. Avoid buying items you wouldn't normally buy simply because they are on sale.  Check online and in newspapers for weekly deals.  Buy healthy items from the bulk bins when available, such as herbs, spices, flours, pastas, nuts, and  dried fruit.  Buy fruits and vegetables that are in season. Prices are usually lower on in-season produce.  Compare and contrast different items. You can do this by looking at the unit price on the price tag. Use it to compare different brands and sizes to find out which item is the best deal.  Choose naturally low-cost healthy items, such as carrots, potatoes, apples, bananas, and oranges. Dried or canned beans are a low-cost protein source.  Buy in bulk and freeze extra food. Items you can buy in bulk include meats, fish, poultry, frozen fruits, and frozen vegetables.  Limit the purchase of prepared or "ready-to-eat" foods, such as pre-cut fruits and vegetables and pre-made salads.  If possible, shop around to discover which grocery store offers the best prices. Some stores charge much more than other stores for the same items.  Do not shop when you are hungry. If you shop while hungry, It may be hard to stick to your list and budget.  Stick to your list and resist impulse buys. Treat your list as your official plan for the week.  Buy a variety of vegetables and fruit by purchasing fresh, frozen, and canned items.  Look beyond eye level. Foods at eye level (adult or child eye level) are more expensive. Look  at the top and bottom shelves for deals.  Be efficient with your time when shopping. The more time you spend at the store, the more money you are likely to spend.  Consider other retailers such as dollar stores, larger [employer], local fruit and vegetable stands, and farmers markets. What are some tips for less expensive food substitutions? When choosing more expensive foods like meats and dairy, try these tips to save money:  Choose cheaper cuts of meat, such as bone-in chicken thighs and drumsticks instead skinless and boneless chicken. When you are ready to prepare the chicken, you can remove the skin yourself to make it healthier.  Choose lean meats like chicken or  [place]. When choosing ground beef, make sure it is lean ground beef (92% lean, 8% fat). If you do buy a fattier ground beef, drain the fat before eating.  Buy dried beans and peas, such as lentils, split peas, or kidney beans.  For seafood, choose canned tuna, salmon, or sardines.  Eggs are a low-cost source of protein.  Buy the larger tubs of yogurt instead of individual-sized containers.  Choose water instead of sodas and other sweetened beverages.  Skip buying chips, cookies, and other "junk food". These items are usually expensive, high in calories, and low in nutritional value. How can I prepare the foods I buy in the healthiest way? Practice these tips for cooking foods in the healthiest way to reduce excess fat and calorie intake:  Steam, saute, grill, or bake foods instead of frying them.  Make sure half your plate is filled with fruits or vegetables. Choose from fresh, frozen, or canned fruits and vegetables. If eating canned, remember to rinse them before eating. This will remove any excess salt added for packaging.  Trim all fat from meat before cooking. Remove the skin from chicken or [place].  Spoon off fat from meat dishes once they have been chilled in the refrigerator and the fat has hardened on the top.  Use skim milk, low-fat milk, or evaporated skim milk when making cream sauces, soups, or puddings.  Substitute low-fat yogurt, sour cream, or cottage cheese for sour cream and mayonnaise in dips and dressings.  Try lemon juice, herbs, or spices to season food instead of salt, butter, or margarine.   This information is not intended to replace advice given to you by your health care provider. Make sure you discuss any questions you have with your health care provider.   Document Released: 10/31/2013 Document Reviewed: 10/31/2013 Elsevier Interactive Patient Education [employer].

## 2015-11-12 LAB — PAP IG W/ RFLX HPV ASCU

## 2016-06-17 ENCOUNTER — Ambulatory Visit (INDEPENDENT_AMBULATORY_CARE_PROVIDER_SITE_OTHER): Payer: BLUE CROSS/BLUE SHIELD | Admitting: Family Medicine

## 2016-06-17 VITALS — BP 137/84 | HR 80 | Temp 99.1°F | Resp 16 | Ht 59.0 in | Wt 129.0 lb

## 2016-06-17 DIAGNOSIS — L817 Pigmented purpuric dermatosis: Secondary | ICD-10-CM

## 2016-06-17 DIAGNOSIS — R600 Localized edema: Secondary | ICD-10-CM

## 2016-06-17 MED ORDER — FUROSEMIDE 20 MG PO TABS: 20.0000 mg | ORAL_TABLET | Freq: Every day | ORAL | 1 refills | Status: DC

## 2016-06-17 NOTE — Patient Instructions (Addendum)
The medication is going to help excrete to be extra fluid and salts in your body that is causing the swelling in your lower legs. This will work faster if you're able to let gravity help get the fluid back up to your heart. Therefore pudding your feet on a pillow or 2 or a small piece of foam while you sleep can help. Even elevating your legs a minimal amount can end up making a big difference. If you are sitting a lot at work see if you can put your legs on a stool. Getting up several times an hour to walk around can also help significantly. Consider trying compression socks. I would recommend a medium to high grade compression socks in a small to medium size. You can find these at a sporting goods store or often even at target or the pharmacy but will likely be the easiest in cheapest a fine online such as on [employer]. I have used several varieties but really like the [employer] and Go2 compression brand. Put these on in the morning to prevent recurrence of the swelling during the day and remove them before bed at night. Make sure you are getting plenty of protein and iron in your diet but minimize salt. Make sure you are drinking plenty of water. The medication can lower your potassium which can result in muscle cramping so make sure you get several sources of potassium in your diet daily. See below for a list. Once the swelling resolves, stop the furosemide medication.  If this does not resolve within several weeks or worsens at all, please come back in for further evaluation.     IF you received an x-ray today, you will receive an invoice from [facility]. Please contact [facility] at [phone] with questions or concerns regarding your invoice.   IF you received labwork today, you will receive an invoice from [place]. Please contact [facility] at [phone] with questions or concerns regarding your invoice.   Our billing staff will not be able to assist you with questions regarding  bills from these companies.  You will be contacted with the lab results as soon as they are available. The fastest way to get your results is to activate your My Chart account. Instructions are located on the last page of this paperwork. If you have not heard from [place] regarding the results in 2 weeks, please contact this office.     Peripheral Edema Peripheral edema is swelling that is caused by a buildup of fluid. Peripheral edema most often affects the lower legs, ankles, and feet. It can also develop in the arms, hands, and face. The area of the body that has peripheral edema will look swollen. It may also feel heavy or warm. Your clothes may start to feel tight. Pressing on the area may make a temporary dent in your skin. You may not be able to move your arm or leg as much as usual. There are many causes of peripheral edema. It can be a complication of other diseases, such as congestive heart failure, kidney disease, or a problem with your blood circulation. It also can be a side effect of certain medicines. It often happens to women during pregnancy. Sometimes, the cause is not known. Treating the underlying condition is often the only treatment for peripheral edema. Follow these instructions at home: Pay attention to any changes in your symptoms. Take these actions to help with your discomfort:  Raise (elevate) your legs while you are sitting or lying  down.  Move around often to prevent stiffness and to lessen swelling. Do not sit or stand for long periods of time.  Wear support stockings as told by your health care provider.  Follow instructions from your health care provider about limiting salt (sodium) in your diet. Sometimes eating less salt can reduce swelling.  Take over-the-counter and prescription medicines only as told by your health care provider. Your health care provider may prescribe medicine to help your body get rid of excess water (diuretic).  Keep all follow-up visits as  told by your health care provider. This is important. Contact a health care provider if:  You have a fever.  Your edema starts suddenly or is getting worse, especially if you are pregnant or have a medical condition.  You have swelling in only one leg.  You have increased swelling and pain in your legs. Get help right away if:  You develop shortness of breath, especially when you are lying down.  You have pain in your chest or abdomen.  You feel weak.  You faint. This information is not intended to replace advice given to you by your health care provider. Make sure you discuss any questions you have with your health care provider. Document Released: 04/06/2004 Document Revised: 08/02/2015 Document Reviewed: 09/09/2014 Elsevier Interactive Patient Education  2017 Elsevier Inc. Potassium Content of Foods Potassium is a mineral found in many foods and drinks. It helps keep fluids and minerals balanced in your body and affects how steadily your heart beats. Potassium also helps control your blood pressure and keep your muscles and nervous system healthy. Certain health conditions and medicines may change the balance of potassium in your body. When this happens, you can help balance your level of potassium through the foods that you do or do not eat. Your health care provider or dietitian may recommend an amount of potassium that you should have each day. The following lists of foods provide the amount of potassium (in parentheses) per serving in each item. High in potassium The following foods and beverages have 200 mg or more of potassium per serving:  Apricots, 2 raw or 5 dry (200 mg).  Artichoke, 1 medium (345 mg).  Avocado, raw,  each (245 mg).  Banana, 1 medium (425 mg).  Beans, lima, or baked beans, canned,  cup (280 mg).  Beans, white, canned,  cup (595 mg).  Beef roast, 3 oz (320 mg).  Beef, ground, 3 oz (270 mg).  Beets, raw or cooked,  cup (260 mg).  Bran  muffin, 2 oz (300 mg).  Broccoli,  cup (230 mg).  Brussels sprouts,  cup (250 mg).  Cantaloupe,  cup (215 mg).  Cereal, 100% bran,  cup (200-400 mg).  Cheeseburger, single, fast food, 1 each (225-400 mg).  Chicken, 3 oz (220 mg).  Clams, canned, 3 oz (535 mg).  Crab, 3 oz (225 mg).  Dates, 5 each (270 mg).  Dried beans and peas,  cup (300-475 mg).  Figs, dried, 2 each (260 mg).  Fish: halibut, tuna, cod, snapper, 3 oz (480 mg).  Fish: salmon, haddock, swordfish, perch, 3 oz (300 mg).  Fish, tuna, canned 3 oz (200 mg).  [place] fries, fast food, 3 oz (470 mg).  Granola with fruit and nuts,  cup (200 mg).  Grapefruit juice,  cup (200 mg).  Greens, beet,  cup (655 mg).  Honeydew melon,  cup (200 mg).  Kale, raw, 1 cup (300 mg).  Kiwi, 1 medium (240 mg).  Kohlrabi,  rutabaga, parsnips,  cup (280 mg).  Lentils,  cup (365 mg).  Mango, 1 each (325 mg).  Milk, chocolate, 1 cup (420 mg).  Milk: nonfat, low-fat, whole, buttermilk, 1 cup (350-380 mg).  Molasses, 1 Tbsp (295 mg).  Mushrooms,  cup (280) mg.  Nectarine, 1 each (275 mg).  Nuts: almonds, peanuts, hazelnuts, [place], cashew, mixed, 1 oz (200 mg).  Nuts, pistachios, 1 oz (295 mg).  Orange, 1 each (240 mg).  Orange juice,  cup (235 mg).  Papaya, medium,  fruit (390 mg).  Peanut butter, chunky, 2 Tbsp (240 mg).  Peanut butter, smooth, 2 Tbsp (210 mg).  Pear, 1 medium (200 mg).  Pomegranate, 1 whole (400 mg).  Pomegranate juice,  cup (215 mg).  Pork, 3 oz (350 mg).  Potato chips, salted, 1 oz (465 mg).  Potato, baked with skin, 1 medium (925 mg).  Potatoes, boiled,  cup (255 mg).  Potatoes, mashed,  cup (330 mg).  Prune juice,  cup (370 mg).  Prunes, 5 each (305 mg).  Pudding, chocolate,  cup (230 mg).  Pumpkin, canned,  cup (250 mg).  Raisins, seedless,  cup (270 mg).  Seeds, sunflower or pumpkin, 1 oz (240 mg).  Soy milk, 1 cup (300 mg).  Spinach,   cup (420 mg).  Spinach, canned,  cup (370 mg).  Sweet potato, baked with skin, 1 medium (450 mg).  Swiss chard,  cup (480 mg).  Tomato or vegetable juice,  cup (275 mg).  Tomato sauce or puree,  cup (400-550 mg).  Tomato, raw, 1 medium (290 mg).  Tomatoes, canned,  cup (200-300 mg).  [place], 3 oz (250 mg).  Wheat germ, 1 oz (250 mg).  Winter squash,  cup (250 mg).  Yogurt, plain or fruited, 6 oz (260-435 mg).  Zucchini,  cup (220 mg). Moderate in potassium The following foods and beverages have 50-200 mg of potassium per serving:  Apple, 1 each (150 mg).  Apple juice,  cup (150 mg).  Applesauce,  cup (90 mg).  Apricot nectar,  cup (140 mg).  Asparagus, small spears,  cup or 6 spears (155 mg).  Bagel, cinnamon raisin, 1 each (130 mg).  Bagel, egg or plain, 4 in., 1 each (70 mg).  Beans, green,  cup (90 mg).  Beans, yellow,  cup (190 mg).  Beer, regular, 12 oz (100 mg).  Beets, canned,  cup (125 mg).  Blackberries,  cup (115 mg).  Blueberries,  cup (60 mg).  Bread, whole wheat, 1 slice (70 mg).  Broccoli, raw,  cup (145 mg).  Cabbage,  cup (150 mg).  Carrots, cooked or raw,  cup (180 mg).  Cauliflower, raw,  cup (150 mg).  Celery, raw,  cup (155 mg).  Cereal, bran flakes, cup (120-150 mg).  Cheese, cottage,  cup (110 mg).  Cherries, 10 each (150 mg).  Chocolate, 1 oz bar (165 mg).  Coffee, brewed 6 oz (90 mg).  Corn,  cup or 1 ear (195 mg).  Cucumbers,  cup (80 mg).  Egg, large, 1 each (60 mg).  Eggplant,  cup (60 mg).  Endive, raw, cup (80 mg).  English muffin, 1 each (65 mg).  Fish, orange roughy, 3 oz (150 mg).  Frankfurter, beef or pork, 1 each (75 mg).  Fruit cocktail,  cup (115 mg).  Grape juice,  cup (170 mg).  Grapefruit,  fruit (175 mg).  Grapes,  cup (155 mg).  Greens: kale, turnip, collard,  cup (110-150 mg).  Ice cream or frozen yogurt,  chocolate,  cup (175 mg).  Ice  cream or frozen yogurt, vanilla,  cup (120-150 mg).  Lemons, limes, 1 each (80 mg).  Lettuce, all types, 1 cup (100 mg).  Mixed vegetables,  cup (150 mg).  Mushrooms, raw,  cup (110 mg).  Nuts: walnuts, pecans, or macadamia, 1 oz (125 mg).  Oatmeal,  cup (80 mg).  Okra,  cup (110 mg).  Onions, raw,  cup (120 mg).  Peach, 1 each (185 mg).  Peaches, canned,  cup (120 mg).  Pears, canned,  cup (120 mg).  Peas, green, frozen,  cup (90 mg).  Peppers, green,  cup (130 mg).  Peppers, red,  cup (160 mg).  Pineapple juice,  cup (165 mg).  Pineapple, fresh or canned,  cup (100 mg).  Plums, 1 each (105 mg).  Pudding, vanilla,  cup (150 mg).  Raspberries,  cup (90 mg).  Rhubarb,  cup (115 mg).  Rice, wild,  cup (80 mg).  Shrimp, 3 oz (155 mg).  Spinach, raw, 1 cup (170 mg).  Strawberries,  cup (125 mg).  Summer squash  cup (175-200 mg).  Swiss chard, raw, 1 cup (135 mg).  Tangerines, 1 each (140 mg).  Tea, brewed, 6 oz (65 mg).  Turnips,  cup (140 mg).  Watermelon,  cup (85 mg).  Wine, red, table, 5 oz (180 mg).  Wine, white, table, 5 oz (100 mg). Low in potassium The following foods and beverages have less than 50 mg of potassium per serving.  Bread, white, 1 slice (30 mg).  Carbonated beverages, 12 oz (less than 5 mg).  Cheese, 1 oz (20-30 mg).  Cranberries,  cup (45 mg).  Cranberry juice cocktail,  cup (20 mg).  Fats and oils, 1 Tbsp (less than 5 mg).  Hummus, 1 Tbsp (32 mg).  Nectar: papaya, mango, or pear,  cup (35 mg).  Rice, white or brown,  cup (50 mg).  Spaghetti or macaroni,  cup cooked (30 mg).  Tortilla, flour or corn, 1 each (50 mg).  Waffle, 4 in., 1 each (50 mg).  Water chestnuts,  cup (40 mg). This information is not intended to replace advice given to you by your health care provider. Make sure you discuss any questions you have with your health care provider. Document Released: 10/11/2004  Document Revised: 08/05/2015 Document Reviewed: 01/24/2013 Elsevier Interactive Patient Education  2017 [employer].

## 2016-06-17 NOTE — Progress Notes (Signed)
By signing my name below, I, Mesha Guinyard, attest that this documentation has been prepared under the direction and in the presence of Norberto Sorenson, MD.  Electronically Signed: Arvilla Market, Medical Scribe. 06/17/16. 4:00 PM.  Subjective:    Patient ID: Margaret Flynn, female    DOB: 06-08-1970, 46 y.o.   MRN: [id number]  HPI Chief Complaint  Patient presents with  . Edema    Lower legs, feet    HPI Comments: Margaret Flynn is a 46 y.o. female who presents to the Primary Care at [facility] complaining of bilateral leg edema onset 2 weeks ago. Her hyperthyroidism is followed by Dr. Horald Pollen. Status post radioactive iodine therapy Dec 2016. She does have asthma that become exacerbated with seasonal allergies. CBC, and CMP nl 7 months prior. I also wonder about possible dose change. I asked but there was a language barrier. Pt is not fasting.  Swelling is noticed after work. It worsens at the end of the day, and improves in the morning. She mentions this is the first time she's had swelling in her legs. Pt did feel SOB at one point but not recently. Pt exercises regularly, and reports occasional difficulty to sleep. Pt has not tried medical treatment for her sxs. She drinks about 0.5 gallons of water and eats the normal amount of salt - does not over use salt.  She is not taking any new vitamins or other OTC supplements/medications. Does not use hormones (birth control) and she is not a smoker. She is not taking ASA, tylenol, or motrin. Denies pain in her legs.  Hyperthyroidism: Pt is still followed by Dr. Horald Pollen and had biannual appts but since her thyroid has improved she is seen annually instead. Pt is compliant with synthroid and was told she could eventually discontinue. She reportedly was approved to skip a doses every Sunday or when she's sick, so she's compliant with her medication the other 6 days out of the week.  Patient Active Problem List   Diagnosis Date Noted  . Asthma  11/10/2015  . Hyperthyroidism 11/28/2014   Past Medical History:  Diagnosis Date  . Asthma    Past Surgical History:  Procedure Laterality Date  . CESAREAN SECTION     No Known Allergies Prior to Admission medications   Medication Sig Start Date End Date Taking? Authorizing Provider  levothyroxine (SYNTHROID, LEVOTHROID) 75 MCG tablet Take 75 mcg by mouth daily before breakfast.   Yes Historical Provider, MD  albuterol (PROVENTIL HFA;VENTOLIN HFA) 108 (90 BASE) MCG/ACT inhaler Inhale 2 puffs into the lungs every 6 (six) hours as needed for wheezing or shortness of breath. Patient not taking: Reported on 11/11/2015 10/31/14   Thao P Le, DO  Fluticasone-Salmeterol (ADVAIR DISKUS) 250-50 MCG/DOSE AEPB Inhale 1 puff into the lungs 2 (two) times daily. Gargle with water and spit after each inhalation Patient not taking: Reported on 06/17/2016 11/11/15   Sherren Mocha, MD  triamcinolone cream (KENALOG) 0.1 % Apply 1 application topically 2 (two) times daily as needed. For itching allergic rash Patient not taking: Reported on 06/17/2016 11/11/15   Sherren Mocha, MD   Social History   Social History  . Marital status: Married    Spouse name: N/A  . Number of children: N/A  . Years of education: N/A   Occupational History  . Not on file.   Social History Main Topics  . Smoking status: Never Smoker  . Smokeless tobacco: Never Used  . Alcohol use  Not on file  . Drug use: Unknown  . Sexual activity: Not on file   Other Topics Concern  . Not on file   Social History Narrative  . No narrative on file   Review of Systems  Respiratory: Negative for shortness of breath.   Cardiovascular: Positive for leg swelling.  Musculoskeletal: Negative for myalgias.  Psychiatric/Behavioral: Positive for sleep disturbance.   Objective:  Physical Exam  Constitutional: She appears well-developed and well-nourished. No distress.  HENT:  Head: Normocephalic and atraumatic.  Eyes: Conjunctivae are normal.    Neck: Neck supple.  Cardiovascular: Regular rhythm.  Tachycardia present.   Pulmonary/Chest: Effort normal.  Musculoskeletal:  1+ non pitting edema to the mid calf Poorly define non blanching hyperpigmented macule with subtle induration Spotted in 2-4 cm circles over lower bilateral legs  Neurological: She is alert.  Skin: Skin is warm and dry.  Psychiatric: She has a normal mood and affect. Her behavior is normal.  Nursing note and vitals reviewed.  BP 137/84   Pulse 80   Temp 99.1 F (37.3 C) (Oral)   Resp 16   Ht  (1.499 m)   Wt 129 lb (58.5 kg)   SpO2 100%   BMI 26.05 kg/m      Assessment & Plan:   1. Pedal edema   2. Pigmented purpuric dermatosis   This is all very subtle and I'm not sure the slight discoloration/bruising is clinically significant - likely just a from the slight increase in edema. Check labs - get copy of Dr. Willeen Cass last note to ensure pt's med use of only 6d/wk is compliant with Dr. Willeen Cass instructions since there is a language barrier.  Try short term prn lasix, elevate, compression hose, increase h20. Recheck if continues or worsens.  Orders Placed This Encounter  Procedures  . CBC with Differential/Platelet  . Sedimentation Rate  . C-reactive protein  . Comprehensive metabolic panel  . Thyroid Panel With TSH    Meds ordered this encounter  Medications  . furosemide (LASIX) 20 MG tablet    Sig: Take 1 tablet (20 mg total) by mouth daily. As needed for lower extremity edema    Dispense:  30 tablet    Refill:  1   Language level caveat - husband translated.  I personally performed the services described in this documentation, which was scribed in my presence. The recorded information has been reviewed and considered, and addended by me as needed.   Norberto Sorenson, M.D.  Primary Care at [facility] [address] [place], [place] [address] [phone] phone [phone] fax  06/19/16 1:18 PM

## 2016-06-18 LAB — COMPREHENSIVE METABOLIC PANEL WITH GFR
ALT: 14 [IU]/L (ref 0–32)
AST: 17 [IU]/L (ref 0–40)
Albumin/Globulin Ratio: 1.3 (ref 1.2–2.2)
Albumin: 4.3 g/dL (ref 3.5–5.5)
Alkaline Phosphatase: 130 [IU]/L — ABNORMAL HIGH (ref 39–117)
BUN/Creatinine Ratio: 18 (ref 9–23)
BUN: 14 mg/dL (ref 6–24)
Bilirubin Total: 0.2 mg/dL (ref 0.0–1.2)
CO2: 26 mmol/L (ref 18–29)
Calcium: 9.1 mg/dL (ref 8.7–10.2)
Chloride: 101 mmol/L (ref 96–106)
Creatinine, Ser: 0.79 mg/dL (ref 0.57–1.00)
GFR calc Af Amer: 104 mL/min/{1.73_m2}
GFR calc non Af Amer: 90 mL/min/{1.73_m2}
Globulin, Total: 3.4 g/dL (ref 1.5–4.5)
Glucose: 103 mg/dL — ABNORMAL HIGH (ref 65–99)
Potassium: 4.1 mmol/L (ref 3.5–5.2)
Sodium: 142 mmol/L (ref 134–144)
Total Protein: 7.7 g/dL (ref 6.0–8.5)

## 2016-06-18 LAB — CBC WITH DIFFERENTIAL/PLATELET
BASOS: 0 %
Basophils Absolute: 0 10*3/uL (ref 0.0–0.2)
EOS (ABSOLUTE): 0.2 10*3/uL (ref 0.0–0.4)
EOS: 4 %
HEMATOCRIT: 36.3 % (ref 34.0–46.6)
Hemoglobin: 11.9 g/dL (ref 11.1–15.9)
IMMATURE GRANS (ABS): 0 10*3/uL (ref 0.0–0.1)
IMMATURE GRANULOCYTES: 1 %
Lymphocytes Absolute: 1.7 10*3/uL (ref 0.7–3.1)
Lymphs: 26 %
MCH: 26.5 pg — ABNORMAL LOW (ref 26.6–33.0)
MCHC: 32.8 g/dL (ref 31.5–35.7)
MCV: 81 fL (ref 79–97)
Monocytes Absolute: 0.4 10*3/uL (ref 0.1–0.9)
Monocytes: 5 %
NEUTROS PCT: 64 %
Neutrophils Absolute: 4.4 10*3/uL (ref 1.4–7.0)
Platelets: 312 10*3/uL (ref 150–379)
RBC: 4.49 x10E6/uL (ref 3.77–5.28)
RDW: 14.1 % (ref 12.3–15.4)
WBC: 6.8 10*3/uL (ref 3.4–10.8)

## 2016-06-18 LAB — C-REACTIVE PROTEIN: CRP: 7.3 mg/L — ABNORMAL HIGH (ref 0.0–4.9)

## 2016-06-18 LAB — THYROID PANEL WITH TSH
Free Thyroxine Index: 2.2 (ref 1.2–4.9)
T3 Uptake Ratio: 25 % (ref 24–39)
T4, Total: 8.6 ug/dL (ref 4.5–12.0)
TSH: 12.02 u[IU]/mL — AB (ref 0.450–4.500)

## 2016-06-18 LAB — SEDIMENTATION RATE: Sed Rate: 42 mm/hr — ABNORMAL HIGH (ref 0–32)

## 2016-06-19 ENCOUNTER — Encounter: Payer: Self-pay | Admitting: Family Medicine

## 2016-06-19 NOTE — Progress Notes (Signed)
Called and left a message for medical records department at Dr. Willeen Cass office requesting last OV note and labs for review.  06/19/16 11:18am

## 2016-08-29 IMAGING — NM NM RAI THERAPY FOR HYPERTHYROIDISM
1 series · 1 of 1 positions shown · non-contrast
Comparison: Thyroid uptake and scan 01/26/2015

CLINICAL DATA: Hyperthyroidism. TSH equal 0.008. Patient's imaging
findings and laboratory values consistent with Graves disease.

EXAM:
RADIOACTIVE IODINE THERAPY FOR HYPERTHYROIDISM
TECHNIQUE: Radioactive iodine prescribed by Dr. Tiger. The risks and benefits
of radioactive iodine therapy were discussed with the patient in
detail by Dr. Darrius . Alternative therapies were also mentioned.
Radiation safety was discussed with the patient, including how to
protect the general public from exposure. There were no barriers to
communication. Written consent was obtained. The patient then
received a capsule containing the radiopharmaceutical.
The patient will follow-up with the referring physician.
RADIOPHARMACEUTICALS:  14.1 mCi L-VRV sodium iodide orally

[st static image · 1 of 1 slices shown]
[im 1/1]
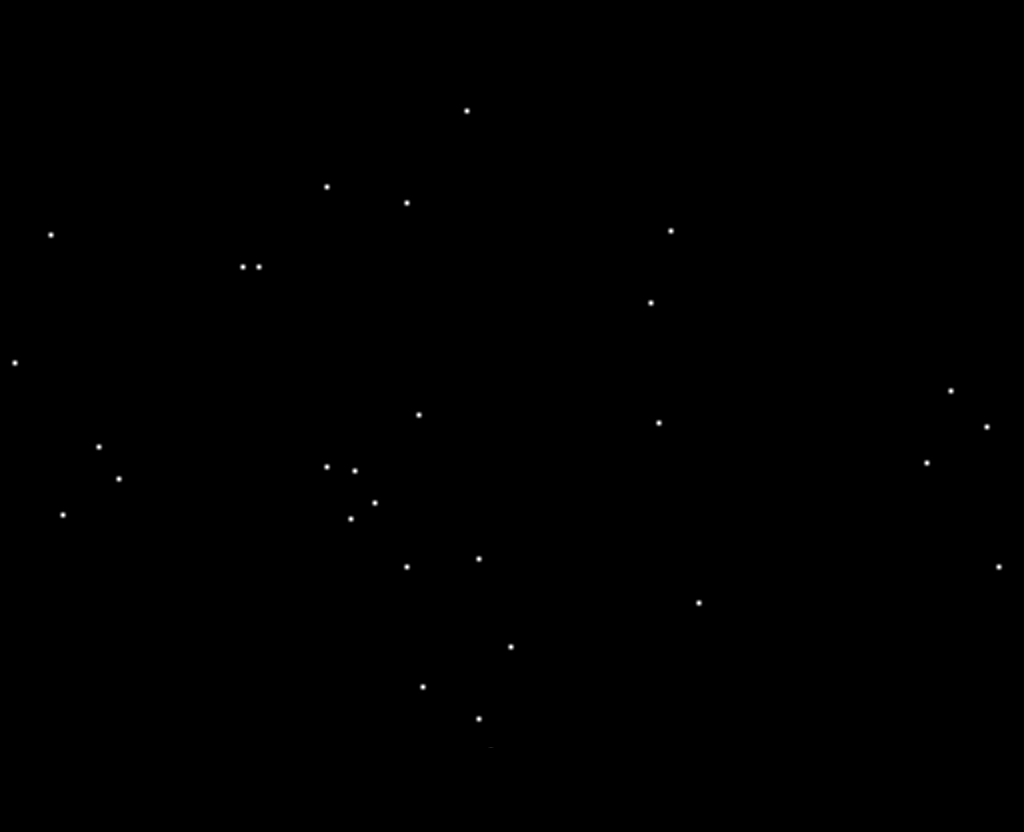

[1 of 1 positions shown; findings below may reference images not displayed]

IMPRESSION: Per oral administration of L-VRV sodium iodide for the treatment of
hyperthyroidism (Graves disease).

## 2016-10-04 NOTE — Progress Notes (Signed)
Labs sent to Dr. Talmage NapBalan

## 2016-11-16 ENCOUNTER — Encounter: Payer: Self-pay | Admitting: Family Medicine

## 2016-11-16 ENCOUNTER — Ambulatory Visit (INDEPENDENT_AMBULATORY_CARE_PROVIDER_SITE_OTHER): Payer: BLUE CROSS/BLUE SHIELD | Admitting: Family Medicine

## 2016-11-16 VITALS — BP 120/76 | HR 69 | Temp 97.7°F | Resp 16 | Ht 59.45 in | Wt 121.0 lb

## 2016-11-16 DIAGNOSIS — J4520 Mild intermittent asthma, uncomplicated: Secondary | ICD-10-CM | POA: Diagnosis not present

## 2016-11-16 DIAGNOSIS — N911 Secondary amenorrhea: Secondary | ICD-10-CM

## 2016-11-16 DIAGNOSIS — Z136 Encounter for screening for cardiovascular disorders: Secondary | ICD-10-CM

## 2016-11-16 DIAGNOSIS — E039 Hypothyroidism, unspecified: Secondary | ICD-10-CM | POA: Diagnosis not present

## 2016-11-16 DIAGNOSIS — R700 Elevated erythrocyte sedimentation rate: Secondary | ICD-10-CM

## 2016-11-16 DIAGNOSIS — Z23 Encounter for immunization: Secondary | ICD-10-CM | POA: Diagnosis not present

## 2016-11-16 DIAGNOSIS — R600 Localized edema: Secondary | ICD-10-CM

## 2016-11-16 DIAGNOSIS — Z1383 Encounter for screening for respiratory disorder NEC: Secondary | ICD-10-CM | POA: Diagnosis not present

## 2016-11-16 DIAGNOSIS — Z1389 Encounter for screening for other disorder: Secondary | ICD-10-CM

## 2016-11-16 DIAGNOSIS — Z0000 Encounter for general adult medical examination without abnormal findings: Secondary | ICD-10-CM

## 2016-11-16 DIAGNOSIS — L502 Urticaria due to cold and heat: Secondary | ICD-10-CM

## 2016-11-16 LAB — POCT URINALYSIS DIP (MANUAL ENTRY)
BILIRUBIN UA: NEGATIVE
GLUCOSE UA: NEGATIVE mg/dL
Ketones, POC UA: NEGATIVE mg/dL
LEUKOCYTES UA: NEGATIVE
NITRITE UA: NEGATIVE
PH UA: 7 (ref 5.0–8.0)
Protein Ur, POC: NEGATIVE mg/dL
Spec Grav, UA: 1.02 (ref 1.010–1.025)
Urobilinogen, UA: 0.2 E.U./dL

## 2016-11-16 MED ORDER — HYDROXYZINE HCL 25 MG PO TABS: 25.0000 mg | ORAL_TABLET | Freq: Three times a day (TID) | ORAL | 4 refills | Status: DC | PRN

## 2016-11-16 MED ORDER — CETIRIZINE HCL 10 MG PO TABS: 10.0000 mg | ORAL_TABLET | Freq: Every day | ORAL | 11 refills | Status: DC

## 2016-11-16 MED ORDER — FUROSEMIDE 20 MG PO TABS: 20.0000 mg | ORAL_TABLET | Freq: Every day | ORAL | 2 refills | Status: DC

## 2016-11-16 NOTE — Patient Instructions (Addendum)
You need to schedule your mammogram.  Please call [facility] at [phone] to schedule.     IF you received an x-ray today, you will receive an invoice from [facility]. Please contact [facility] at [phone] with questions or concerns regarding your invoice.   IF you received labwork today, you will receive an invoice from [facility]. Please contact [facility] at [phone] with questions or concerns regarding your invoice.   Our billing staff will not be able to assist you with questions regarding bills from these companies.  You will be contacted with the lab results as soon as they are available. The fastest way to get your results is to activate your My Chart account. Instructions are located on the last page of this paperwork. If you have not heard from [place] regarding the results in 2 weeks, please contact this office.     Exercise-Induced Hives Hives are itchy, swollen areas on the skin. Exercise-induced hives are hives that are caused by physical activity. They can develop on any part of your body during or after exercise. Exercise-induced hives may look like blotches or welts. They are often red on the outer edges and white in the middle. Pressing the center of a hive may cause it to turn white (blanch). Hives can be a symptom of an allergic reaction. They may happen with other symptoms of an allergic reaction, including:  Skin color changes.  Trouble breathing.  Stomach cramps.  Headache.  Swelling of the face, tongue, or hands.  Follow these instructions at home: Watch your exercise-induced hives for any changes. The following actions may help to lessen any discomfort you are feeling:  Pay attention to any changes in your symptoms.  Stop exercising as soon as you develop hives. Slowing down or stopping physical activity can help keep hives from getting worse.  Take over-the-counter and prescription medicines only as told  by your health care provider.  Apply a cold compress or bathe in cool water as told by your health care provider. This may help relieve itching.  Keep track of the foods you eat before exercising. Sometimes eating certain foods before you exercise increases the chance of developing exercise-induced hives. Paying attention to what you eat for a few weeks can help you identify possible triggers or patterns in your condition.  Avoid known triggers when possible.  Treat symptoms as soon as they start.  Keep all follow-up visits as told by your health care provider. This is important.  Contact a health care provider if:  Your hives do not go away in 5-10 minutes.  You develop any other symptoms.  You continue to develop hives for 1 month or more. Get help right away if:  You develop swelling of your lips, tongue, or throat.  You have trouble breathing.  You are wheezing.  You develop tightness in your chest or throat.  You develop hives, redness, or itching all over your body that gets worse.  You develop severe vomiting or diarrhea.  You feel faint or pass out. This information is not intended to replace advice given to you by your health care provider. Make sure you discuss any questions you have with your health care provider. Document Released: 02/27/2005 Document Revised: 04/06/2015 Document Reviewed: 09/02/2014 Elsevier Interactive Patient Education  2018 [employer].   Pht ban (Hives) Pha?t ban (ma?y ?ay) la? nh??ng ch? ng??a, ?o? va? s?ng trn da quy? vi?. Pha?t ban co? th? xu?t hi?n trn b?t ky? b? ph?n  na?o trn c? th? va? co? th? co? ki?ch th???c kha?c nhau. Chu?ng co? th? chi? nho? b??ng ??u bu?t ho??c l??n h?n nhi?u. Pha?t ban th???ng h?t trong vo?ng 24 gi?? (pha?t ban c?p ti?nh). Trong nh??ng tr???ng h??p kha?c, ca?c v?t pha?t ban m??i xu?t hi?n sau khi ca?c v?t cu? m?t ?i. Chu ky? na?y co? th? ti?p tu?c trong va?i nga?y ho??c va?i tu?n (pha?t  ban ma?n ti?nh). Pha?t ban la? k?t qua? cu?a pha?n ??ng cu?a c? th? quy? vi? v??i m?t ch?t ki?ch thi?ch ho??c v??i m?t th?? ma? quy? vi? bi? di? ??ng (ta?c nhn kh??i pha?t). Khi quy? vi? ti?p xu?c v??i m?t ta?c nhn kho?i pha?t, c? th? quy? vi? gia?i pho?ng m?t ho?a ch?t (histamine) gy ?o?, ng??a va? s?ng. Quy? vi? co? th? bi? pha?t ban ngay sau khi ti?p xu?c v??i ta?c nhn kh??i pha?t ho??c sau ?o? ha?ng gi??. Pht ban khng ly t? ng??i sang ng??i (khng ly lan). Cc vng pht ban cu?a quy? vi? c th? tr?m tr?ng h?n n?u gi, t?p th? d?c v b? c?ng th?ng tinh th?n. NGUYN NHN Nh??ng nguyn nhn gy ra tnh tr?ng ny bao g?m:  Di? ??ng v??i m?t s? loa?i th??c ?n ho??c cc tha?nh ph?n nh?t ??nh.  Cn trng c?n ho?c ??t.  Ti?p xu?c v??i ph?n hoa ho??c va?y da v?t nui.  Ti?p xc v?i cao su ho??c ha ch?t.  ?? lu d???i n??ng, no?ng ho??c la?nh (ph?i nhi?m).  T?p luy?n.  C?ng th?ng. Quy? vi? cu?ng co? th? bi? pha?t ban do m?t s? tnh tr?ng b?nh ly? va? cc bi?n php ?i?u tri?Marland Kitchen Nh?ng tnh tr?ng ny bao g?m:  Cc vi ru?t, bao g?m c? c?m l?nh thng th??ng.  Nhi?m vi khu?n, ch??ng ha?n nh? nhi?m tru?ng ????ng ti?u va? vim ho?ng do lin c?u khu?n.  Nh??ng r?i loa?n nh? vim m?ch, lupus ho?c b?nh tuy?n gip.  M?t s? loa?i thu?c nh?t ??nh.  Tim thu?c ch??a di? ??ng.  Truy?n mu. ?i khi nguyn nhn gy pha?t ban khng ????c bi?t ??n (pha?t ban t?? pha?t). CC Y?U T? NGUY C? Tnh tr?ng ny hay x?y ra h?n ?:  Ph? n?.  Nh??ng ng???i bi? di? ??ng th??c ph?m, ???c bi?t di? ??ng v??i cam quy?t, s??a, tr??ng, la?c, ca?c loa?i ha?t ho??c tm cua so? h?n.  Nh??ng ng???i bi? di? ??ng v??i: ? Thu?c. ? Cao su. ? Cn tru?ng. ? ??ng v?t. ? Ph?n hoa.  Nh??ng ng???i bi? m?t s? b?nh ly? nh?t ?i?nh, bao g?mlupus ho??c b?nh tuy?n gia?p. TRI?U CH?NG Tri?u ch??ng chi?nh cu?a b?nh na?y la? ca?c u cu?c ho??c ma?ng ?o? ho??c tr??ng n?i ln,  ng??atrn da cu?a quy? vi?. Nh??ng khu v??c na?y co? th?:  L??n ln va? s?ng (v?t s?ng).  Thay ??i kch th??c va? vi? tri? nhanh chng v l?p ?i l?p l?i.  La? pha?t ban ring r? ho??c k?t v??i nhau trn m?t vu?ng da l??n.  ?au nho?i ho??c tr?? nn ?au ???n.  Chuy?n tha?nh ma?u tr??ng khi ?n va?o gi??a (tr?ng nh??t). Trong nh??ng tr???ng h??p n?ng, tay, chn va? m??tquy? vi? cu?ng co? th? bi? s?ng. ?i?u ny c th? x?y ra n?u pht ban pht tri?n su h?n trong da. CH?N ?ON Tnh tr?ng ny c th? ???c ch?n ?on d?a vo tri?u ch??ng, khai thc b?nh s? v khm th?c th?. Da, n???c ti?u ho??c ma?u co? th? ????c xe?t nghi?m ?? ti?m nguyn nhn pha?t ban va? ?? loa?i tr?? ca?c v?n ?? s??c kho?e kha?c. Chuyn  gia ch?m so?c s??c kho?e cu?ng co? th? l?y m?t m?u da nho? t?? vu?ng bi? a?nh h???ng ?? ki?m tra d???i ki?nh hi?n vi (sinh thi?t). ?I?U TR? Vi?c ?i?u tr? ty thu?c va?o m?c ?? n?ng c?a b?nh. Chuyn gia ch?m so?c s??c kho?e co? th? khuy?n nghi? s?? du?ng mi?ng va?i la?nh, ???t (b?ng e?p la?nh) ho??c t??m n???c ma?t ?? gia?m ng??a. ?i khi pha?t ban ????c ?i?u tri? b??ng thu?c, bao g?m:  Thu?c khng histamine.  Corticosteroid.  Khng sinh.  M?t loa?i thu?c tim (omalizumab). Chuyn gia ch?m so?c s??c kho?e cu?a quy? vi? co? th? k ??n thu?c na?y n?u quy? vi? bi? pha?t ban t?? pha?t ma?n ti?nh va? quy? vi? ti?p tu?c co? ca?c tri?u ch??ng ngay ca? sau khi ?i?u tri? b??ng thu?c kha?ng histamine. Nh??ng tr???ng h??p n?ng co? th? c?n tim kh?n c?p adrenaline (epinephrine) ?? tra?nh pha?n ??ng di? ??ng ?e do?a ma?ng s?ng (pha?n v?). H??NG D?N CH?M [place] T?I NH Thu?c  Ch? dng ho?c bi thu?c khng c?n k ??n v thu?c c?n k ??n theo ch? d?n c?a chuyn gia ch?m [place] s?c kh?e.  N?u qu v? ???c k thu?c khng sinh, hy dng thu?c theo ch? d?n c?a chuyn gia ch?m [place] s?c kh?e. Khng d?ng u?ng thu?c khng sinh ngay c? khi qu v? b?t ??u c?m th?y ?? h?n. Ch?m  Victoria Vera da  Ch??m mt vo vng b? ?nh h??ng.  Khng gi ho?c ch xt vo da qu v?. H??ng d?n chung  Khng t??m vo?i hoa sen ho??c t??m b?n b??ng n???c no?ng. Vi?c na?y co? th? la?m ng??a nhi?u thm.  Khng m??c qu?n a?o ch?t.  S?? du?ng kem ch?ng n??ng va? qu?n a?o ba?o h? khi quy? vi? ra ngoa?i tr??i.  Trnh b?t k? ch?t no ma? c th? gy pht ban. Ghi nh?t k ?? giu?p quy? vi? theo di nh?ng g lm qu v? pht ban. Ghi l?i: ? Quy? vi? du?ng thu?c gi?Marland Kitchen Ladell Heads v? ?n v u?ng g. ? Quy? vi? s?? du?ng sa?n ph?m na?o trn da.  Tun th? t?t c? cc cu?c h?n khm l?i theo ch? d?n c?a chuyn gia ch?m [place] s?c kh?e. ?i?u ny c vai tr quan tr?ng. ?I KHM N?U:  Tri?u ch?ng c?a qu v? khng ki?m sot ???c b?ng thu?c.  Cc kh?p c?a qu v? b? ?au ho?c s?ng.  NGAY L?P T?C ?I KHM N?U:  Qu v? b? s?t.  Qu v? b? ?au ? b?ng.  L??i ho?c mi c?a qu v? b? s?ng.  Mi? m??t cu?a quy? vi? bi? s?ng.  Quy? vi? c?m th?y th?t ngh?t ? ng?c ho?c ? h?ng.  Qu v? b? kh th? ho?c kh nu?t. Nh?ng tri?u ch?ng ny c th? l m?t v?n ?? nghim tr?ng c?n c?p c?u. Khng ch? xem tri?u ch?ng c h?t khng. Hy ?i khm ngay l?p t?c. G?i cho d?ch v? c?p c?u t?i ??a ph??ng (911 ? [place]?). Khng t? li xe ??n b?nh vi?n. Thng tin ny khng nh?m m?c ?ch thay th? cho l?i khuyn m chuyn gia ch?m Why s?c kh?e ni v?i qu v?. Hy b?o ??m qu v? ph?i th?o lu?n b?t k? v?n ?? g m qu v? c v?i chuyn gia ch?m [place] s?c kh?e c?a qu v?. Document Released: 02/27/2005 Document Revised: 06/21/2015 Document Reviewed: 12/16/2014 Elsevier Interactive Patient Education  2017 Elsevier Inc.  Cetirizine tablets ?y l thu?c g? CETIRIZINE l thu?c khng histamine. Thu?c ny ???c dng ?? ?i?u tr? ho?c phng ng?a cc  tri?u ch?ng d? ?ng. N c?ng ???c dng ?? gip gi?m n?i ban ng?a trn da v my ?ay. Thu?c ny c th? ???c dng cho nh?ng m?c ?ch khc; hy h?i ng??i cung c?p d?ch v? y t? ho?c d??c s? c?a mnh, n?u qu v? c  th?c m?c. (CC) NHN HI?U PH? BI?N: All Day Allergy, Zyrtec, Zyrtec Hives Relief Ti c?n ph?i bo cho ng??i cung c?p d?ch v? y t? c?a mnh ?i?u g tr??c khi dng thu?c ny? H? c?n bi?t li?u qu v? hi?n c b?t k? tnh tr?ng no sau ?y hay khng: -b?nh th?n -b?nh gan -ph?n ?ng b?t th??ng ho?c d? ?ng v??i cetirizine ho?c hydroxyzine -pha?n ??ng b?t th???ng ho??c di? ??ng v??i ca?c d??c ph?m kha?c, th?c ph?m, thu?c nhu?m, ho??c ch?t ba?o qua?n -?ang c thai ho??c ??nh co? thai -?ang cho con bu? Ti nn s? d?ng thu?c ny nh? th? no? U?ng thu?c ny v?i m?t ly n??c. Hy lm theo cc h??ng d?n trn h?p thu?c ho?c nhn thu?c. Qu v? c th? u?ng thu?c ny cng v?i th?c ?n ho?c khi bao t? ?ang tr?ng. Dng thu?c ny vo cng m?t th?i ?i?m m?i ngy. Khng ???c dng thu?c ny nhi?u l?n h?n ? ???c ch? d?n. Qu v? c th? c?n ph?i dng thu?c ny trong vi ngy tr??c khi cc tri?u ch?ng ???c c?i thi?n. Hy bn v?i bc s? nhi khoa c?a qu v? v? vi?c dng thu?c ny ? tr? em. C th? c?n ch?m [place] ??c bi?t. Thu?c ny c th? ???c k toa cho tr? em ch? m?i 6 tu?i trong nh?ng tr??ng h?p ch?n l?c, nh?ng c?n ph?i th?n tr?ng. Qu li?u: N?u qu v? cho r?ng mnh ? dng qu nhi?u thu?c ny, th hy lin l?c v?i trung tm ki?m sot ch?t ??c ho?c phng c?p c?u ngay l?p t?c. L?U : Thu?c ny ch? dnh ring cho qu v?. Khng chia s? thu?c ny v?i nh?ng ng??i khc. N?u ti l? qun m?t li?u th sao? N?u qu v? l? qun m?t li?u thu?c, hy dng li?u thu?c ? ngay khi c th?. N?u h?u nh? ? ??n gi? dng li?u thu?c k? ti?p, th ch? dng li?u thu?c k? ti?p ?Marland Kitchen. Khng ???c dng li?u g?p ?i ho?c dng thm li?u. Nh?ng g c th? t??ng tc v?i thu?c ny? -r??u -m?t s? thu?c dng cho ch?ng lo u ho?c cc v?n ?? v? gi?c ng? -cc thu?c gi?m ?au gy ng? (narcotic) -cc thu?c khc dng cho cc ch?ng c?m l?nh ho?c d? ?ng Danh sch ny c th? khng m t? ?? h?t cc t??ng tc c th? x?y ra. Hy ??a cho ng??i cung c?p d?ch v? y t? c?a mnh  danh sch t?t c? cc thu?c, th?o d??c, cc thu?c khng c?n toa, ho?c cc ch? ph?m b? sung m qu v? dng. C?ng nn bo cho h? bi?t r?ng qu v? c ht thu?c, u?ng r??u, ho?c c s? d?ng ma ty tri php hay khng. Vi th? c th? t??ng tc v?i thu?c c?a qu v?. Ti c?n ph?i theo di ?i?u g trong khi dng thu?c ny? Hy ??n g?p bc s? ho?c [place] vin y t? ?? theo di ??nh k? s?c kh?e c?a mnh. Hy bo cho bc s? ho?c chuyn vin y t?, n?u cc tri?u ch?ng c?a mnh khng kh h?n. Qu v? c th? b? bu?n ng? ho?c chng m?t. Khng ???c li xe, s? d?ng my mc, ho?c lm nh?ng vi?c c?n ph?i t?nh to cho t?i khi  qu v? bi?t ???c thu?c ny ?nh h??ng ln qu v? nh? th? no. Khng ???c ng?i d?y ho?c ??ng d?y nhanh, ??c bi?t l khi qu v? l b?nh nhn l?n tu?i. ?i?u ny lm gi?m nguy c? b? chng m?t ho?c ng?t x?u. Qu v? c th? b? kh mi?ng. Nhai k?o cao su (chewing gum) khng c ???ng ho?c ng?m k?o c?ng, v u?ng nhi?u n??c c th? c ch. Hy lin l?c v?i bc s?, n?u v?n ?? nghim tr?ng ho?c khng ch?m d?t. Ti c th? nh?n th?y nh?ng tc d?ng ph? no khi dng thu?c ny? Nh?ng tc d?ng ph? qu v? c?n ph?i bo cho bc s? ho?c chuyn vin y t? cng s?m cng t?t: -cc ph?n ?ng d? ?ng, ch?ng h?n nh? da b? m?n ??, ng?a, n?i my ?ay, s?ng ? m?t, mi, ho?c l??i -thay ??i th? l?c -thay ??i thnh l?c -tim ??p nhanh ho?c khng ??u -kh ?i ti?u ho?c thay ??i l??ng n??c ti?u ???c bi ti?t Cc tc d?ng ph? khng c?n ph?i ch?m [place] y t? (hy bo cho bc s? ho?c chuyn vin y t?, n?u cc tc d?ng ph? ny ti?p di?n ho?c gy phi?n toi): -chng m?t -kh mi?ng -d? cu k?nh -?au h?ng -?au b?ng -c?m th?y m?t m?i Danh sch ny c th? khng m t? ?? h?t cc tc d?ng ph? c th? x?y ra. Xin g?i t?i bc s? c?a mnh ?? ???c c? v?n chuyn mn v? cc tc d?ng ph?Ladell Heads v? c th? t??ng trnh cc tc d?ng ph? cho FDA theo s? [phone]. Ti nn c?t gi? thu?c c?a mnh ? ?u? ?? ngoi t?m tay tr? em. C?t gi? ? nhi?t ?? phng t? 15 ??n 30  ?? C (59 ??n 86 ?? F). V?t b? t?t c? thu?c ch?a dng sau ngy h?t h?n in trn nhn thu?c ho?c bao thu?c. L?U : ?y l b?n tm t?t. N c th? khng bao hm t?t c? thng tin c th? c. N?u qu v? th?c m?c v? thu?c ny, xin trao ??i v?i bc s?, d??c s?, ho?c ng??i cung c?p d?ch v? y t? c?a mnh.  2018 Elsevier/Gold Standard (2016-03-30 00:00:00)

## 2016-11-16 NOTE — Progress Notes (Deleted)
Subjective:    Patient ID: Margaret Flynn, female    DOB: 25-Feb-1971, 46 y.o.   MRN: [id number]  HPI No chief complaint on file.   HPI Comments: Margaret Flynn is a 46 y.o. female who presents to the Primary Care at [facility] and [facility] complaining of bilateral leg edema onset 2 weeks ago. Her hyperthyroidism is followed by Dr. Horald Pollen. Status post radioactive iodine therapy Dec 2016. She does have asthma that become exacerbated with seasonal allergies.  language barrier. Pt is not fasting.  Swelling is noticed after work. It worsens at the end of the day, and improves in the morning. She mentions this is the first time she's had swelling in her legs. Pt did feel SOB at one point but not recently. Pt exercises regularly, and reports occasional difficulty to sleep. Pt has not tried medical treatment for her sxs. She drinks about 0.5 gallons of water and eats the normal amount of salt - does not over use salt.  She is not taking any new vitamins or other OTC supplements/medications. Does not use hormones (birth control) and she is not a smoker. She is not taking ASA, tylenol, or motrin. Denies pain in her legs.  Hyperthyroidism: Pt is still followed by Dr. Horald Pollen and had biannual appts but since her thyroid has improved she is seen annually instead. Pt is compliant with synthroid and was told she could eventually discontinue. She reportedly was approved to skip a doses every Sunday or when she's sick, so she's compliant with her medication the other 6 days out of the week.  Patient Active Problem List   Diagnosis Date Noted  . Asthma 11/10/2015  . Hyperthyroidism 11/28/2014   Past Medical History:  Diagnosis Date  . Asthma    Past Surgical History:  Procedure Laterality Date  . CESAREAN SECTION     No Known Allergies Prior to Admission medications   Medication Sig Start Date End Date Taking? Authorizing Provider  levothyroxine (SYNTHROID, LEVOTHROID) 75 MCG tablet Take 75 mcg by  mouth daily before breakfast.   Yes Historical Provider, MD  albuterol (PROVENTIL HFA;VENTOLIN HFA) 108 (90 BASE) MCG/ACT inhaler Inhale 2 puffs into the lungs every 6 (six) hours as needed for wheezing or shortness of breath. Patient not taking: Reported on 11/11/2015 10/31/14   Thao P Le, DO  Fluticasone-Salmeterol (ADVAIR DISKUS) 250-50 MCG/DOSE AEPB Inhale 1 puff into the lungs 2 (two) times daily. Gargle with water and spit after each inhalation Patient not taking: Reported on 06/17/2016 11/11/15   Sherren Mocha, MD  triamcinolone cream (KENALOG) 0.1 % Apply 1 application topically 2 (two) times daily as needed. For itching allergic rash Patient not taking: Reported on 06/17/2016 11/11/15   Sherren Mocha, MD   Social History   Social History  . Marital status: Married    Spouse name: Trellis Moment  . Number of children: N/A  . Years of education: N/A   Occupational History  . Not on file.   Social History Main Topics  . Smoking status: Never Smoker  . Smokeless tobacco: Never Used  . Alcohol use Not on file  . Drug use: Unknown  . Sexual activity: Not on file   Other Topics Concern  . Not on file   Social History Narrative  . No narrative on file   Review of Systems  Respiratory: Negative for shortness of breath.   Cardiovascular: Positive for leg swelling.  Musculoskeletal: Negative for myalgias.  Psychiatric/Behavioral: Positive for sleep  disturbance.   Objective:  Physical Exam  Constitutional: She appears well-developed and well-nourished. No distress.  HENT:  Head: Normocephalic and atraumatic.  Eyes: Conjunctivae are normal.  Neck: Neck supple.  Cardiovascular: Regular rhythm.  Tachycardia present.   Pulmonary/Chest: Effort normal.  Musculoskeletal:  1+ non pitting edema to the mid calf Poorly define non blanching hyperpigmented macule with subtle induration Spotted in 2-4 cm circles over lower bilateral legs  Neurological: She is alert.  Skin: Skin is warm and dry.    Psychiatric: She has a normal mood and affect. Her behavior is normal.  Nursing note and vitals reviewed.  There were no vitals taken for this visit.    Assessment & Plan:   No diagnosis found.This is all very subtle and I'm not sure the slight discoloration/bruising is clinically significant - likely just a from the slight increase in edema. Check labs - get copy of Dr. Willeen CassBalan's last note to ensure pt's med use of only 6d/wk is compliant with Dr. Willeen CassBalan's instructions since there is a language barrier.  Try short term prn lasix, elevate, compression hose, increase h20. Recheck if continues or worsens.  No orders of the defined types were placed in this encounter.   No orders of the defined types were placed in this encounter.  Language level caveat - husband translated.   Norberto SorensonEva Shaw, M.D.  [facility]  [place] [address] [place], [place] [address] [phone] phone [phone] fax  11/16/16 7:02 AM

## 2016-11-16 NOTE — Progress Notes (Signed)
Subjective:    Patient ID: Margaret Flynn, female    DOB: 1970/08/06, 46 y.o.   MRN: [id number] Chief Complaint  Patient presents with  . Annual Exam    HPI  Margaret Flynn is a delightful 46 yo woman here today for her full CPE. We used the skype interpretor and her husband was very helpful as well.  Primary Preventative Screenings: Cervical Cancer: Pap smear done last year 11/11/2015.  She went into menopause after 09/14/2015 with no vaginal bleeding since. No FHx of early menopause.  Family Planning: post-menopausal STI screening: declines, long-term monogamous relationship Breast Cancer: none prior. Tobacco use/EtOH/substances: none Bone Density: eats yogurt daily for calcium Cardiac: no prior EKG Weight/Blood sugar/Diet/Exercise: good diet, walks req for exercise and on her feet all day at work OTC/Vit/Supp/Herbal: none Dentist/Optho: Immunizations:  Immunization History  Administered Date(s) Administered  . Influenza,inj,Quad PF,6+ Mos 11/16/2016  . Pneumococcal Polysaccharide-23 11/16/2016  . Tdap 11/11/2015     Chronic Medical Conditions: Hypothyroid: Saw Dr. Talmage Nap several weeks ago and just had med increased back to 7d a week.   Pedal edema: Left medial ankle swelling at night after walking a lot during the day. For the past 2-3 months has recurred. Swelling is noticed after work. It worsens at the end of the day, and improves in the morning. She mentions this is the first time she's had swelling in her legs. Pt did feel SOB at one point but not recently. Pt exercises regularly, and reports occasional difficulty to sleep. Pt has not tried medical treatment for her sxs. She drinks about 0.5 gallons of water and eats the normal amount of salt - does not over use salt.  She is not taking any new vitamins or other OTC supplements/medications. Does not use hormones (birth control) and she is not a smoker. She is not taking ASA, tylenol, or motrin. Denies pain in her legs.  Drinking 2-3L of  water a day.    Transient erythematous rash and itching on legs after exercise: She was c/o the same thing last year at her CPE. I rx'ed her triamcinolone which she used for a short time - several weeks w/o benefit.  Now she is just using body lotion.    Asthma:  Using the advair only at night sometimes when wheezing - only 3-4x/mo.   Past Medical History:  Diagnosis Date  . Asthma    Past Surgical History:  Procedure Laterality Date  . CESAREAN SECTION     Current Outpatient Prescriptions on File Prior to Visit  Medication Sig Dispense Refill  . Fluticasone-Salmeterol (ADVAIR DISKUS) 250-50 MCG/DOSE AEPB Inhale 1 puff into the lungs 2 (two) times daily. Gargle with water and spit after each inhalation 1 each 12  . levothyroxine (SYNTHROID, LEVOTHROID) 75 MCG tablet Take 75 mcg by mouth daily before breakfast.     No current facility-administered medications on file prior to visit.    No Known Allergies History reviewed. No pertinent family history. Social History   Social History  . Marital status: Married    Spouse name: Trellis Moment  . Number of children: N/A  . Years of education: N/A   Social History Main Topics  . Smoking status: Never Smoker  . Smokeless tobacco: Never Used  . Alcohol use None  . Drug use: Unknown  . Sexual activity: Not Asked   Other Topics Concern  . None   Social History Narrative  . None   Depression screen [facility] 2/9 11/16/2016 06/17/2016 11/11/2015 10/31/2014  10/31/2014  Decreased Interest 0 0 0 0 0  Down, Depressed, Hopeless 0 0 0 0 0  PHQ - 2 Score 0 0 0 0 0    Review of Systems  Cardiovascular: Positive for leg swelling.  Skin: Positive for color change and rash. Negative for wound.  All other systems reviewed and are negative.  See hpi    Objective:   Physical Exam  Constitutional: She is oriented to person, place, and time. She appears well-developed and well-nourished. No distress.  HENT:  Head: Normocephalic and atraumatic.  Right  Ear: Tympanic membrane, external ear and ear canal normal.  Left Ear: Tympanic membrane, external ear and ear canal normal.  Nose: Nose normal. No mucosal edema or rhinorrhea.  Mouth/Throat: Uvula is midline, oropharynx is clear and moist and mucous membranes are normal. No posterior oropharyngeal erythema.  Eyes: Pupils are equal, round, and reactive to light. Conjunctivae and EOM are normal. Right eye exhibits no discharge. Left eye exhibits no discharge. No scleral icterus.  Neck: Normal range of motion. Neck supple. No thyromegaly present.  Cardiovascular: Normal rate, regular rhythm, normal heart sounds and intact distal pulses.   Pulmonary/Chest: Effort normal and breath sounds normal. No respiratory distress.  Abdominal: Soft. Bowel sounds are normal. There is no tenderness.  Musculoskeletal: She exhibits no edema.  Lymphadenopathy:    She has no cervical adenopathy.  Neurological: She is alert and oriented to person, place, and time. She has normal reflexes.  Skin: Skin is warm and dry. She is not diaphoretic. No erythema.  Psychiatric: She has a normal mood and affect. Her behavior is normal.      BP 120/76   Pulse 69   Temp 97.7 F (36.5 C) (Oral)   Resp 16   Ht 4' 11.45" (1.51 m)   Wt 121 lb (54.9 kg)   SpO2 99%   BMI 24.07 kg/m   Assessment & Plan:   1. Annual physical exam - reinforced importance of mammogram - pt agreeable.  2. Need for prophylactic vaccination and inoculation against influenza   3. Secondary amenorrhea - ??early menopause right when turn 46 yo but not sig sxs - no menopausal sxs other than amenorrhea so check labs to confirm  4. Intermittent asthma without complication, unspecified asthma severity   5. Urticaria due to heat - reassured, start daily antihistamine like zyrtec. If severe sxs occur, can use prn hydroxyzine to treat  6. Acquired hypothyroidism - followed by Dr. Talmage Nap - dose increased slightly sev wks ago  7. Elevated sed rate   8.  Screening for cardiovascular, respiratory, and genitourinary diseases   9.      Pedal edema - suspect benign, dependent as occurring after standing on her feet all day at work. Reassurance provided. Ok to use prn lasix when sxs are more severe.  Orders Placed This Encounter  Procedures  . Flu Vaccine QUAD 36+ mos IM  . Pneumococcal polysaccharide vaccine 23-valent greater than or equal to 2yo subcutaneous/IM  . Hormone Panel  . Lipid panel    Order Specific Question:   Has the patient fasted?    Answer:   Yes  . Comprehensive metabolic panel    Order Specific Question:   Has the patient fasted?    Answer:   Yes  . CBC with Differential/Platelet  . ANA w/Reflex if Positive  . POCT urinalysis dipstick    Meds ordered this encounter  Medications  . hydrOXYzine (ATARAX/VISTARIL) 25 MG tablet    Sig: Take  1 tablet (25 mg total) by mouth 3 (three) times daily as needed for itching.    Dispense:  30 tablet    Refill:  4  . cetirizine (ZYRTEC) 10 MG tablet    Sig: Take 1 tablet (10 mg total) by mouth at bedtime.    Dispense:  30 tablet    Refill:  11  . furosemide (LASIX) 20 MG tablet    Sig: Take 1 tablet (20 mg total) by mouth daily. As needed for lower extremity edema    Dispense:  30 tablet    Refill:  2    Norberto SorensonEva Natalea Sutliff, M.D.  Primary Care at [facility]  [place] [address] [place], [place] [id number] [phone] phone [phone]) [phone] fax  11/19/16 8:55 AM

## 2016-11-26 LAB — COMPREHENSIVE METABOLIC PANEL
ALBUMIN: 4.7 g/dL (ref 3.5–5.5)
ALT: 31 IU/L (ref 0–32)
AST: 23 IU/L (ref 0–40)
Albumin/Globulin Ratio: 1.2 (ref 1.2–2.2)
Alkaline Phosphatase: 133 IU/L — ABNORMAL HIGH (ref 39–117)
BILIRUBIN TOTAL: 0.3 mg/dL (ref 0.0–1.2)
BUN/Creatinine Ratio: 17 (ref 9–23)
BUN: 11 mg/dL (ref 6–24)
CALCIUM: 9.7 mg/dL (ref 8.7–10.2)
CHLORIDE: 99 mmol/L (ref 96–106)
CO2: 21 mmol/L (ref 20–29)
CREATININE: 0.65 mg/dL (ref 0.57–1.00)
GFR calc non Af Amer: 107 mL/min/{1.73_m2} (ref >59)
GFR, EST AFRICAN AMERICAN: 123 mL/min/{1.73_m2} (ref >59)
GLUCOSE: 88 mg/dL (ref 65–99)
Globulin, Total: 3.8 g/dL (ref 1.5–4.5)
Potassium: 4.4 mmol/L (ref 3.5–5.2)
Sodium: 139 mmol/L (ref 134–144)
TOTAL PROTEIN: 8.5 g/dL (ref 6.0–8.5)

## 2016-11-26 LAB — HORMONE PANEL (T4,TSH,FSH,TESTT,SHBG,DHEA,ETC)
DHEA: 43 ug/dL
Estradiol, Serum, MS: 5.4 pg/mL
Estrone Sulfate: 16 ng/dL
FREE TESTOSTERONE, SERUM: 0.8 pg/mL — AB
Follicle Stimulating Hormone: 74 m[IU]/mL
Free T-3: 2.5 pg/mL
Sex Hormone Binding Globulin: 86.8 nmol/L
T4: 10.8 ug/dL
TESTOSTERONE, SERUM (TOTAL): 14 ng/dL
TESTOSTERONE-% FREE: 0.6 % — AB
TRIIODOTHYRONINE (T-3), SERUM: 104 ng/dL
TSH: 1.9 uU/mL

## 2016-11-26 LAB — CBC WITH DIFFERENTIAL/PLATELET
BASOS ABS: 0 10*3/uL (ref 0.0–0.2)
Basos: 0 %
EOS (ABSOLUTE): 0.1 10*3/uL (ref 0.0–0.4)
Eos: 2 %
HEMOGLOBIN: 13.8 g/dL (ref 11.1–15.9)
Hematocrit: 42 % (ref 34.0–46.6)
Immature Grans (Abs): 0 10*3/uL (ref 0.0–0.1)
Immature Granulocytes: 0 %
LYMPHS ABS: 1.3 10*3/uL (ref 0.7–3.1)
Lymphs: 21 %
MCH: 27.7 pg (ref 26.6–33.0)
MCHC: 32.9 g/dL (ref 31.5–35.7)
MCV: 84 fL (ref 79–97)
MONOCYTES: 9 %
Monocytes Absolute: 0.5 10*3/uL (ref 0.1–0.9)
NEUTROS ABS: 4.1 10*3/uL (ref 1.4–7.0)
Neutrophils: 68 %
Platelets: 275 10*3/uL (ref 150–379)
RBC: 4.99 x10E6/uL (ref 3.77–5.28)
RDW: 14.2 % (ref 12.3–15.4)
WBC: 6 10*3/uL (ref 3.4–10.8)

## 2016-11-26 LAB — LIPID PANEL
CHOL/HDL RATIO: 2.8 ratio (ref 0.0–4.4)
Cholesterol, Total: 218 mg/dL — ABNORMAL HIGH (ref 100–199)
HDL: 77 mg/dL (ref >39)
LDL Calculated: 125 mg/dL — ABNORMAL HIGH (ref 0–99)
Triglycerides: 78 mg/dL (ref 0–149)
VLDL Cholesterol Cal: 16 mg/dL (ref 5–40)

## 2016-11-26 LAB — ANA W/REFLEX IF POSITIVE: ANA: NEGATIVE

## 2017-04-30 ENCOUNTER — Other Ambulatory Visit: Payer: Self-pay | Admitting: Family Medicine

## 2017-10-30 DIAGNOSIS — E890 Postprocedural hypothyroidism: Secondary | ICD-10-CM | POA: Diagnosis not present

## 2017-11-06 DIAGNOSIS — E890 Postprocedural hypothyroidism: Secondary | ICD-10-CM | POA: Diagnosis not present

## 2017-11-15 ENCOUNTER — Telehealth: Payer: Self-pay | Admitting: Family Medicine

## 2017-11-15 NOTE — Telephone Encounter (Signed)
Left a VM in regards to her appt she has with Dr. Clelia Croft on 11/26/2017. The provider is taking a leave of absence and needs to be rescheduled with a different provider or with Dr. Clelia Croft in Oct.

## 2017-11-26 ENCOUNTER — Encounter: Payer: BLUE CROSS/BLUE SHIELD | Admitting: Family Medicine

## 2017-12-31 ENCOUNTER — Telehealth: Payer: Self-pay | Admitting: Family Medicine

## 2017-12-31 NOTE — Telephone Encounter (Signed)
LVM for pt to call back to the office and let us know if they can move their appt on 02/01/18 with Dr. Clelia Croft from 10:40 to 10:30. With the arrival time of 10:20.  If pt calls back, please leave in notes if this is acceptable.  Thank you!

## 2018-02-01 ENCOUNTER — Other Ambulatory Visit: Payer: Self-pay

## 2018-02-01 ENCOUNTER — Ambulatory Visit (INDEPENDENT_AMBULATORY_CARE_PROVIDER_SITE_OTHER): Payer: BLUE CROSS/BLUE SHIELD | Admitting: Family Medicine

## 2018-02-01 ENCOUNTER — Encounter: Payer: Self-pay | Admitting: Family Medicine

## 2018-02-01 ENCOUNTER — Encounter: Payer: BLUE CROSS/BLUE SHIELD | Admitting: Family Medicine

## 2018-02-01 VITALS — BP 127/78 | HR 70 | Temp 98.0°F | Ht 59.45 in | Wt 122.0 lb

## 2018-02-01 DIAGNOSIS — R600 Localized edema: Secondary | ICD-10-CM

## 2018-02-01 DIAGNOSIS — Z1383 Encounter for screening for respiratory disorder NEC: Secondary | ICD-10-CM

## 2018-02-01 DIAGNOSIS — Z0000 Encounter for general adult medical examination without abnormal findings: Secondary | ICD-10-CM

## 2018-02-01 DIAGNOSIS — J4520 Mild intermittent asthma, uncomplicated: Secondary | ICD-10-CM | POA: Diagnosis not present

## 2018-02-01 DIAGNOSIS — Z130 Encounter for screening for diseases of the blood and blood-forming organs and certain disorders involving the immune mechanism: Secondary | ICD-10-CM

## 2018-02-01 DIAGNOSIS — Z0001 Encounter for general adult medical examination with abnormal findings: Secondary | ICD-10-CM

## 2018-02-01 DIAGNOSIS — E039 Hypothyroidism, unspecified: Secondary | ICD-10-CM

## 2018-02-01 DIAGNOSIS — Z780 Asymptomatic menopausal state: Secondary | ICD-10-CM

## 2018-02-01 DIAGNOSIS — Z136 Encounter for screening for cardiovascular disorders: Secondary | ICD-10-CM | POA: Diagnosis not present

## 2018-02-01 DIAGNOSIS — Z1389 Encounter for screening for other disorder: Secondary | ICD-10-CM | POA: Diagnosis not present

## 2018-02-01 LAB — POCT URINALYSIS DIP (MANUAL ENTRY)
BILIRUBIN UA: NEGATIVE
Blood, UA: NEGATIVE
Glucose, UA: NEGATIVE mg/dL
Ketones, POC UA: NEGATIVE mg/dL
LEUKOCYTES UA: NEGATIVE
NITRITE UA: NEGATIVE
PH UA: 6.5 (ref 5.0–8.0)
Protein Ur, POC: NEGATIVE mg/dL
Spec Grav, UA: 1.01 (ref 1.010–1.025)
Urobilinogen, UA: 0.2 E.U./dL

## 2018-02-01 MED ORDER — ALBUTEROL SULFATE (2.5 MG/3ML) 0.083% IN NEBU
2.5000 mg | INHALATION_SOLUTION | Freq: Once | RESPIRATORY_TRACT | Status: AC
  Administered 2018-02-01: 2.5 mg via RESPIRATORY_TRACT

## 2018-02-01 MED ORDER — IPRATROPIUM BROMIDE 0.02 % IN SOLN
0.5000 mg | Freq: Once | RESPIRATORY_TRACT | Status: AC
  Administered 2018-02-01: 0.5 mg via RESPIRATORY_TRACT

## 2018-02-01 NOTE — Progress Notes (Signed)
Subjective:    Patient ID: Margaret Flynn; female   DOB: 1970/07/23; 47 y.o.   MRN: [id number]  Chief Complaint  Patient presents with  . Annual Exam    HPI Primary Preventative Screenings: Cervical Cancer:  Family Planning: STI screening: Breast Cancer: Colorectal Cancer: Tobacco use/EtOH/substances: Bone Density: Cardiac: Weight/Blood sugar/Diet/Exercise: BMI Readings from Last 3 Encounters:  02/01/18 24.27 kg/m  11/16/16 24.07 kg/m  06/17/16 26.05 kg/m   No results found for: HGBA1C OTC/Vit/Supp/Herbal: Dentist/Optho: Immunizations:  Immunization History  Administered Date(s) Administered  . Influenza,inj,Quad PF,6+ Mos 11/16/2016, 01/09/2018  . Pneumococcal Polysaccharide-23 11/16/2016  . Tdap 11/11/2015     Chronic Medical Conditions: She has dramatic improvement in the itching and swelling - still just sometimes just when sitting at work for along time she will get some pedal edema.  On days off of works when she active and moving it doesn't really occur.    Hypothyroid: Followed by Dr. Talmage Nap every 6 months - las tappt wsa in Sept - gets labs the week prior thte appt  BreAINTHG IS DOING BETTER.  Using inhaler once in a while - was initially using every day but things iproved.  Usin advair just when she feels [facility] - more often with the weather change.  Then she will use it once or twice then feel back to beter.  Medical History: Past Medical History:  Diagnosis Date  . Asthma    Past Surgical History:  Procedure Laterality Date  . CESAREAN SECTION     Current Outpatient Medications on File Prior to Visit  Medication Sig Dispense Refill  . ADVAIR DISKUS 250-50 MCG/DOSE AEPB INHALE 1 PUFF INTO THE LUNGS TWICE DAILY, GARGLE WITH WATER AND SPIT AFTER EACH INHALATION 1 each 0  . cetirizine (ZYRTEC) 10 MG tablet Take 1 tablet (10 mg total) by mouth at bedtime. 30 tablet 11  . furosemide (LASIX) 20 MG tablet Take 1 tablet (20 mg total) by mouth daily. As needed  for lower extremity edema 30 tablet 2  . hydrOXYzine (ATARAX/VISTARIL) 25 MG tablet Take 1 tablet (25 mg total) by mouth 3 (three) times daily as needed for itching. 30 tablet 4  . levothyroxine (SYNTHROID, LEVOTHROID) 75 MCG tablet Take 75 mcg by mouth daily before breakfast.     No current facility-administered medications on file prior to visit.    No Known Allergies History reviewed. No pertinent family history. Social History   Socioeconomic History  . Marital status: Married    Spouse name: Trellis Moment  . Number of children: Not on file  . Years of education: Not on file  . Highest education level: Not on file  Occupational History  . Not on file  Social Needs  . Financial resource strain: Not on file  . Food insecurity:    Worry: Not on file    Inability: Not on file  . Transportation needs:    Medical: Not on file    Non-medical: Not on file  Tobacco Use  . Smoking status: Never Smoker  . Smokeless tobacco: Never Used  Substance and Sexual Activity  . Alcohol use: Not on file  . Drug use: Not on file  . Sexual activity: Not on file  Lifestyle  . Physical activity:    Days per week: Not on file    Minutes per session: Not on file  . Stress: Not on file  Relationships  . Social connections:    Talks on phone: Not on file    Gets  together: Not on file    Attends religious service: Not on file    Active member of club or organization: Not on file    Attends meetings of clubs or organizations: Not on file    Relationship status: Not on file  Other Topics Concern  . Not on file  Social History Narrative  . Not on file   Depression screen [facility] 2/9 02/01/2018 11/16/2016 06/17/2016 11/11/2015 10/31/2014  Decreased Interest 0 0 0 0 0  Down, Depressed, Hopeless 0 0 0 0 0  PHQ - 2 Score 0 0 0 0 0     ROS Otherwise as noted in HPI.  Objective:  BP 127/78   Pulse 70   Temp 98 F (36.7 C) (Oral)   Ht 4' 11.45" (1.51 m)   Wt 122 lb (55.3 kg)   SpO2 95%   BMI 24.27  kg/m   Visual Acuity Screening   Right eye Left eye Both eyes  Without correction: 20/20 20/20 20/20   With correction:      Physical Exam  Constitutional: She is oriented to person, place, and time. She appears well-developed and well-nourished. No distress.  HENT:  Head: Normocephalic and atraumatic.  Right Ear: Tympanic membrane, external ear and ear canal normal.  Left Ear: Tympanic membrane, external ear and ear canal normal.  Nose: Nose normal. No mucosal edema or rhinorrhea.  Mouth/Throat: Uvula is midline, oropharynx is clear and moist and mucous membranes are normal. No posterior oropharyngeal erythema.  Eyes: Pupils are equal, round, and reactive to light. Conjunctivae and EOM are normal. Right eye exhibits no discharge. Left eye exhibits no discharge. No scleral icterus.  Neck: Normal range of motion. Neck supple. No thyromegaly present.  Cardiovascular: Normal rate, regular rhythm, normal heart sounds and intact distal pulses.  Pulmonary/Chest: Effort normal and breath sounds normal. No respiratory distress.  Abdominal: Soft. Bowel sounds are normal. There is no tenderness.  Musculoskeletal: She exhibits no edema.  Lymphadenopathy:    She has no cervical adenopathy.  Neurological: She is alert and oriented to person, place, and time. She has normal reflexes.  Skin: Skin is warm and dry. She is not diaphoretic. No erythema.  Psychiatric: She has a normal mood and affect. Her behavior is normal.   Peak flow pre-neb: 250. Goal peak flow 457  POC TESTING Office Visit on 02/01/2018  Component Date Value Ref Range Status  . Color, UA 02/01/2018 yellow  yellow Final  . Clarity, UA 02/01/2018 clear  clear Final  . Glucose, UA 02/01/2018 negative  negative mg/dL Final  . Bilirubin, UA 02/01/2018 negative  negative Final  . Ketones, POC UA 02/01/2018 negative  negative mg/dL Final  . Spec Grav, UA 02/01/2018 1.010  1.010 - 1.025 Final  . Blood, UA 02/01/2018 negative  negative  Final  . pH, UA 02/01/2018 6.5  5.0 - 8.0 Final  . Protein Ur, POC 02/01/2018 negative  negative mg/dL Final  . Urobilinogen, UA 02/01/2018 0.2  0.2 or 1.0 E.U./dL Final  . Nitrite, UA 16/12/9602 Negative  Negative Final  . Leukocytes, UA 02/01/2018 Negative  Negative Final     Assessment & Plan:   1. Annual physical exam   2. Screening for cardiovascular, respiratory, and genitourinary diseases   3. Screening for deficiency anemia   4. Acquired hypothyroidism   5. Intermittent asthma without complication, unspecified asthma severity   6. Postmenopausal   7. Pedal edema    Peak flow: BRING IN COMPRESSION SOCKS AND LEAVE OUT FRONG  POSTMENOAPUSAL Patient will continue on current chronic medications other than changes noted above, so ok to refill when needed.   Reviewed all health maintenance recommendations per USPSTF guidelines.   Pt elects to defer pap to next year.  See after visit summary for patient specific instructions.  Orders Placed This Encounter  Procedures  . CBC with Differential/Platelet  . Comprehensive metabolic panel    Order Specific Question:   Has the patient fasted?    Answer:   No  . Lipid panel    Order Specific Question:   Has the patient fasted?    Answer:   No  . Care order/instruction:    Scheduling Instructions:     Peak Flow (IF NEB IS ORDERED PLEASE DO BEFORE AND AFTER NEB)  . POCT urinalysis dipstick    Meds ordered this encounter  Medications  . albuterol (PROVENTIL) (2.5 MG/3ML) 0.083% nebulizer solution 2.5 mg  . ipratropium (ATROVENT) nebulizer solution 0.5 mg    Patient verbalized to me that they understand the following: diagnosis, what is being done for them, what to expect and what should be done at home.  Their questions have been answered. They understand that I am unable to predict every possible medication interaction or adverse outcome and that if any unexpected symptoms arise, they should contact us and their pharmacist, as  well as never hesitate to seek urgent/emergent care at [facility] or ER if they think it might be warranted.    Norberto SorensonEva Daphna Lafuente, MD, MPH Primary Care at [facility]  [facility] [address] [place], [place]  [id number] [phone] Office phone  [phone] Office fax   02/01/18 12:53 PM

## 2018-02-01 NOTE — Patient Instructions (Addendum)
Please stop by the office anytime on Monday 11/25 or later to ask if Dr. Brigitte Pulse left something up there for Margaret Flynn - I will have an envelope with the type of socks she could wear to work. If she like them - purchase MEDIUM GRADE COMPRESSION SOCKS 31mHg or more SIZE SMALL - look on amazon - Dr. SGilman Schmidt [place]ock[place], [employer] - are all great options.     If you have lab work done today you will be contacted with your lab results within the next 2 weeks.  If you have not heard from [place] please contact [place] The fastest way to get your results is to register for My Chart.   IF you received an x-ray today, you will receive an invoice from [facility]. Please contact [facility] at [phone] questions or concerns regarding your invoice.   IF you received labwork today, you will receive an invoice from [facility] Please contact [facility] at [phone] questions or concerns regarding your invoice.   Our billing staff will not be able to assist you with questions regarding bills from these companies.  You will be contacted with the lab results as soon as they are available. The fastest way to get your results is to activate your My Chart account. Instructions are located on the last page of this paperwork. If you have not heard from [place] results in 2 weeks, please contact this office.     Health Maintenance for Postmenopausal Women Menopause is a normal process in which your reproductive ability comes to an end. This process happens gradually over a span of months to years, usually between the ages of 44and 512 Menopause is complete when you have missed 12 consecutive menstrual periods. It is important to talk with your health care provider about some of the most common conditions that affect postmenopausal women, such as heart disease, cancer, and bone loss (osteoporosis). Adopting a healthy lifestyle and getting preventive care can help to promote your health  and wellness. Those actions can also lower your chances of developing some of these common conditions. What should I know about menopause? During menopause, you may experience a number of symptoms, such as:  Moderate-to-severe hot flashes.  Night sweats.  Decrease in sex drive.  Mood swings.  Headaches.  Tiredness.  Irritability.  Memory problems.  Insomnia.  Choosing to treat or not to treat menopausal changes is an individual decision that you make with your health care provider. What should I know about hormone replacement therapy and supplements? Hormone therapy products are effective for treating symptoms that are associated with menopause, such as hot flashes and night sweats. Hormone replacement carries certain risks, especially as you become older. If you are thinking about using estrogen or estrogen with progestin treatments, discuss the benefits and risks with your health care provider. What should I know about heart disease and stroke? Heart disease, heart attack, and stroke become more likely as you age. This may be due, in part, to the hormonal changes that your body experiences during menopause. These can affect how your body processes dietary fats, triglycerides, and cholesterol. Heart attack and stroke are both medical emergencies. There are many things that you can do to help prevent heart disease and stroke:  Have your blood pressure checked at least every 1-2 years. High blood pressure causes heart disease and increases the risk of stroke.  If you are [age]years old, ask your health care provider if you should take aspirin  to prevent a heart attack or a stroke.  Do not use any tobacco products, including cigarettes, chewing tobacco, or electronic cigarettes. If you need help quitting, ask your health care provider.  It is important to eat a healthy diet and maintain a healthy weight. ? Be sure to include plenty of vegetables, fruits, low-fat dairy products, and  lean protein. ? Avoid eating foods that are high in solid fats, added sugars, or salt (sodium).  Get regular exercise. This is one of the most important things that you can do for your health. ? Try to exercise for at least 150 minutes each week. The type of exercise that you do should increase your heart rate and make you sweat. This is known as moderate-intensity exercise. ? Try to do strengthening exercises at least twice each week. Do these in addition to the moderate-intensity exercise.  Know your numbers.Ask your health care provider to check your cholesterol and your blood glucose. Continue to have your blood tested as directed by your health care provider.  What should I know about cancer screening? There are several types of cancer. Take the following steps to reduce your risk and to catch any cancer development as early as possible. Breast Cancer  Practice breast self-awareness. ? This means understanding how your breasts normally appear and feel. ? It also means doing regular breast self-exams. Let your health care provider know about any changes, no matter how small.  If you are 84 or older, have a clinician do a breast exam (clinical breast exam or CBE) every year. Depending on your age, family history, and medical history, it may be recommended that you also have a yearly breast X-ray (mammogram).  If you have a family history of breast cancer, talk with your health care provider about genetic screening.  If you are at high risk for breast cancer, talk with your health care provider about having an MRI and a mammogram every year.  Breast cancer (BRCA) gene test is recommended for women who have family members with BRCA-related cancers. Results of the assessment will determine the need for genetic counseling and BRCA1 and for BRCA2 testing. BRCA-related cancers include these types: ? Breast. This occurs in males or females. ? Ovarian. ? Tubal. This may also be called fallopian  tube cancer. ? Cancer of the abdominal or pelvic lining (peritoneal cancer). ? Prostate. ? Pancreatic.  Cervical, Uterine, and Ovarian Cancer Your health care provider may recommend that you be screened regularly for cancer of the pelvic organs. These include your ovaries, uterus, and vagina. This screening involves a pelvic exam, which includes checking for microscopic changes to the surface of your cervix (Pap test).  For women ages 21-65, health care providers may recommend a pelvic exam and a Pap test every three years. For women ages 104-65, they may recommend the Pap test and pelvic exam, combined with testing for human papilloma virus (HPV), every five years. Some types of HPV increase your risk of cervical cancer. Testing for HPV may also be done on women of any age who have unclear Pap test results.  Other health care providers may not recommend any screening for nonpregnant women who are considered low risk for pelvic cancer and have no symptoms. Ask your health care provider if a screening pelvic exam is right for you.  If you have had past treatment for cervical cancer or a condition that could lead to cancer, you need Pap tests and screening for cancer for at least 20 years  after your treatment. If Pap tests have been discontinued for you, your risk factors (such as having a new sexual partner) need to be reassessed to determine if you should start having screenings again. Some women have medical problems that increase the chance of getting cervical cancer. In these cases, your health care provider may recommend that you have screening and Pap tests more often.  If you have a family history of uterine cancer or ovarian cancer, talk with your health care provider about genetic screening.  If you have vaginal bleeding after reaching menopause, tell your health care provider.  There are currently no reliable tests available to screen for ovarian cancer.  Lung Cancer Lung cancer  screening is recommended for adults 56-45 years old who are at high risk for lung cancer because of a history of smoking. A yearly low-dose CT scan of the lungs is recommended if you:  Currently smoke.  Have a history of at least 30 pack-years of smoking and you currently smoke or have quit within the past 15 years. A pack-year is smoking an average of one pack of cigarettes per day for one year.  Yearly screening should:  Continue until it has been 15 years since you quit.  Stop if you develop a health problem that would prevent you from having lung cancer treatment.  Colorectal Cancer  This type of cancer can be detected and can often be prevented.  Routine colorectal cancer screening usually begins at age 30 and continues through age 34.  If you have risk factors for colon cancer, your health care provider may recommend that you be screened at an earlier age.  If you have a family history of colorectal cancer, talk with your health care provider about genetic screening.  Your health care provider may also recommend using home test kits to check for hidden blood in your stool.  A small camera at the end of a tube can be used to examine your colon directly (sigmoidoscopy or colonoscopy). This is done to check for the earliest forms of colorectal cancer.  Direct examination of the colon should be repeated every 5-10 years until age 60. However, if early forms of precancerous polyps or small growths are found or if you have a family history or genetic risk for colorectal cancer, you may need to be screened more often.  Skin Cancer  Check your skin from head to toe regularly.  Monitor any moles. Be sure to tell your health care provider: ? About any new moles or changes in moles, especially if there is a change in a mole's shape or color. ? If you have a mole that is larger than the size of a pencil eraser.  If any of your family members has a history of skin cancer, especially at a  young age, talk with your health care provider about genetic screening.  Always use sunscreen. Apply sunscreen liberally and repeatedly throughout the day.  Whenever you are outside, protect yourself by wearing long sleeves, pants, a wide-brimmed hat, and sunglasses.  What should I know about osteoporosis? Osteoporosis is a condition in which bone destruction happens more quickly than new bone creation. After menopause, you may be at an increased risk for osteoporosis. To help prevent osteoporosis or the bone fractures that can happen because of osteoporosis, the following is recommended:  If you are 16-66 years old, get at least 1,000 mg of calcium and at least 600 mg of vitamin D per day.  If you are older  than age 81 but younger than age 26, get at least 1,200 mg of calcium and at least 600 mg of vitamin D per day.  If you are older than age 60, get at least 1,200 mg of calcium and at least 800 mg of vitamin D per day.  Smoking and excessive alcohol intake increase the risk of osteoporosis. Eat foods that are rich in calcium and vitamin D, and do weight-bearing exercises several times each week as directed by your health care provider. What should I know about how menopause affects my mental health? Depression may occur at any age, but it is more common as you become older. Common symptoms of depression include:  Low or sad mood.  Changes in sleep patterns.  Changes in appetite or eating patterns.  Feeling an overall lack of motivation or enjoyment of activities that you previously enjoyed.  Frequent crying spells.  Talk with your health care provider if you think that you are experiencing depression. What should I know about immunizations? It is important that you get and maintain your immunizations. These include:  Tetanus, diphtheria, and pertussis (Tdap) booster vaccine.  Influenza every year before the flu season begins.  Pneumonia vaccine.  Shingles vaccine.  Your  health care provider may also recommend other immunizations. This information is not intended to replace advice given to you by your health care provider. Make sure you discuss any questions you have with your health care provider. Document Released: 04/21/2005 Document Revised: 09/17/2015 Document Reviewed: 12/01/2014 Elsevier Interactive Patient Education  2018 [employer].

## 2018-02-02 LAB — COMPREHENSIVE METABOLIC PANEL
A/G RATIO: 1.5 (ref 1.2–2.2)
ALBUMIN: 4.9 g/dL (ref 3.5–5.5)
ALT: 17 IU/L (ref 0–32)
AST: 18 IU/L (ref 0–40)
Alkaline Phosphatase: 111 IU/L (ref 39–117)
BILIRUBIN TOTAL: 0.6 mg/dL (ref 0.0–1.2)
BUN / CREAT RATIO: 19 (ref 9–23)
BUN: 12 mg/dL (ref 6–24)
CHLORIDE: 101 mmol/L (ref 96–106)
CO2: 25 mmol/L (ref 20–29)
Calcium: 9.8 mg/dL (ref 8.7–10.2)
Creatinine, Ser: 0.62 mg/dL (ref 0.57–1.00)
GFR calc Af Amer: 124 mL/min/{1.73_m2} (ref >59)
GFR calc non Af Amer: 108 mL/min/{1.73_m2} (ref >59)
GLUCOSE: 82 mg/dL (ref 65–99)
Globulin, Total: 3.2 g/dL (ref 1.5–4.5)
Potassium: 4.6 mmol/L (ref 3.5–5.2)
Sodium: 142 mmol/L (ref 134–144)
Total Protein: 8.1 g/dL (ref 6.0–8.5)

## 2018-02-02 LAB — CBC WITH DIFFERENTIAL/PLATELET
BASOS ABS: 0 10*3/uL (ref 0.0–0.2)
Basos: 1 %
EOS (ABSOLUTE): 0.2 10*3/uL (ref 0.0–0.4)
Eos: 3 %
Hematocrit: 38.9 % (ref 34.0–46.6)
Hemoglobin: 12.5 g/dL (ref 11.1–15.9)
Immature Grans (Abs): 0 10*3/uL (ref 0.0–0.1)
Immature Granulocytes: 1 %
Lymphocytes Absolute: 2.2 10*3/uL (ref 0.7–3.1)
Lymphs: 34 %
MCH: 27 pg (ref 26.6–33.0)
MCHC: 32.1 g/dL (ref 31.5–35.7)
MCV: 84 fL (ref 79–97)
Monocytes Absolute: 0.4 10*3/uL (ref 0.1–0.9)
Monocytes: 6 %
NEUTROS ABS: 3.7 10*3/uL (ref 1.4–7.0)
Neutrophils: 55 %
Platelets: 290 10*3/uL (ref 150–450)
RBC: 4.63 x10E6/uL (ref 3.77–5.28)
RDW: 13.2 % (ref 12.3–15.4)
WBC: 6.6 10*3/uL (ref 3.4–10.8)

## 2018-02-02 LAB — LIPID PANEL
CHOL/HDL RATIO: 3.1 ratio (ref 0.0–4.4)
CHOLESTEROL TOTAL: 226 mg/dL — AB (ref 100–199)
HDL: 72 mg/dL (ref >39)
LDL CALC: 140 mg/dL — AB (ref 0–99)
TRIGLYCERIDES: 71 mg/dL (ref 0–149)
VLDL Cholesterol Cal: 14 mg/dL (ref 5–40)

## 2018-05-10 ENCOUNTER — Telehealth: Payer: Self-pay | Admitting: Family Medicine

## 2018-05-10 NOTE — Telephone Encounter (Signed)
Called patient in regards to her appt with Dr. Clelia Croft on 02/13/2019. The provider is leaving our office and needs to be rescheduled with another provider or cancelled.

## 2018-10-31 DIAGNOSIS — E890 Postprocedural hypothyroidism: Secondary | ICD-10-CM | POA: Diagnosis not present

## 2018-11-07 DIAGNOSIS — E890 Postprocedural hypothyroidism: Secondary | ICD-10-CM | POA: Diagnosis not present

## 2018-12-09 ENCOUNTER — Encounter: Payer: BLUE CROSS/BLUE SHIELD | Admitting: Family Medicine

## 2019-02-04 ENCOUNTER — Encounter: Payer: Self-pay | Admitting: Family Medicine

## 2019-02-04 ENCOUNTER — Ambulatory Visit (INDEPENDENT_AMBULATORY_CARE_PROVIDER_SITE_OTHER): Payer: BC Managed Care – PPO | Admitting: Family Medicine

## 2019-02-04 ENCOUNTER — Other Ambulatory Visit (HOSPITAL_COMMUNITY)
Admission: RE | Admit: 2019-02-04 | Discharge: 2019-02-04 | Disposition: A | Discharge status: Home / Self Care | Payer: BC Managed Care – PPO | Source: Ambulatory Visit | Attending: Family Medicine | Admitting: Family Medicine

## 2019-02-04 ENCOUNTER — Other Ambulatory Visit: Payer: Self-pay

## 2019-02-04 VITALS — BP 146/80 | HR 63 | Temp 98.8°F | Ht 59.45 in | Wt 125.2 lb

## 2019-02-04 DIAGNOSIS — Z01419 Encounter for gynecological examination (general) (routine) without abnormal findings: Secondary | ICD-10-CM

## 2019-02-04 DIAGNOSIS — R030 Elevated blood-pressure reading, without diagnosis of hypertension: Secondary | ICD-10-CM | POA: Diagnosis not present

## 2019-02-04 DIAGNOSIS — Z13220 Encounter for screening for lipoid disorders: Secondary | ICD-10-CM | POA: Diagnosis not present

## 2019-02-04 DIAGNOSIS — Z130 Encounter for screening for diseases of the blood and blood-forming organs and certain disorders involving the immune mechanism: Secondary | ICD-10-CM

## 2019-02-04 DIAGNOSIS — Z23 Encounter for immunization: Secondary | ICD-10-CM

## 2019-02-04 DIAGNOSIS — Z01411 Encounter for gynecological examination (general) (routine) with abnormal findings: Secondary | ICD-10-CM | POA: Diagnosis not present

## 2019-02-04 DIAGNOSIS — Z13228 Encounter for screening for other metabolic disorders: Secondary | ICD-10-CM

## 2019-02-04 NOTE — Patient Instructions (Addendum)
If you have lab work done today you will be contacted with your lab results within the next 2 weeks.  If you have not heard from [place] then please contact us. The fastest way to get your results is to register for My Chart.   IF you received an x-ray today, you will receive an invoice from [facility]. Please contact [facility] at [phone] with questions or concerns regarding your invoice.   IF you received labwork today, you will receive an invoice from [facility]. Please contact [facility] at [phone] with questions or concerns regarding your invoice.   Our billing staff will not be able to assist you with questions regarding bills from these companies.  You will be contacted with the lab results as soon as they are available. The fastest way to get your results is to activate your My Chart account. Instructions are located on the last page of this paperwork. If you have not heard from [place] regarding the results in 2 weeks, please contact this office.     Ch?m [place] phng ng?a 48-64 tu?i, N? gi?i Preventive Care 48-25 Years Old, Female Ch?m [place] phng ng?a ?? c?p ??n cc l?n khm v?i chuyn gia ch?m [place] s?c kh?e v cc l?a ch?n l?i s?ng c th? t?ng c??ng s?c kh?e v h?nh phc. Nh?ng vi?c ny bao g?m:  Khm th?c th? hng n?m. Vi?c ny c?ng c th? ???c g?i l ki?m tra s?c kh?e hng n?m.  Khm nha khoa v khm m?t th??ng xuyn.  Ch?ng ng?a.  Sng l?c m?t s? tnh tr?ng nh?t ??nh.  Cc l?a ch?n l?i s?ng lnh m?nh, ch?ng h?n nh? ?n ch? ?? ?n t?t cho s?c kh?e, t?p th? d?c ??u ??n, khng s? d?ng ma ty v cc s?n ph?m c nicotine v thu?c l v h?n ch? u?ng r??u bia. Ti c th? d? ki?n nh?ng g ??i v?i l?n khm ch?m [place] phng ng?a c?a ti? Khm th?c th? Chuyn gia ch?m [place] s?c kh?e s? ki?m tra qu v? v?:  Chi?u cao v cn n?ng. Nh?ng ch? s? ny c th? ???c s? d?ng ?? tnh ton ch? s? kh?i c? th? (BMI), cho bi?t qu v? c cn n?ng kh?e m?nh hay khng.  Nh?p tim v  huy?t p.  Da xem c cc ??m b?t th??ng hay khng. T? v?n Chuyn gia ch?m [place] s?c kh?e c th? h?i qu v? v? vi?c:  S? d?ng r??u, thu?c l v ma ty.  Tnh tr?ng s?c kh?e tinh th?n.  Tnh tr?ng h?nh phc ? nh v trong m?i quan h?.  Quan h? tnh d?c.  Thi quen ?n u?ng.  Cng vi?c v mi tr??ng lm vi?c.  Ph??ng php ng?a [place].  Chu k? kinh nguy?t.  Ti?n s? mang thai. Ti c?n ch?ng ng?a nh?ng g?  V?c xin cm.  V?c xin ny ???c khuy?n ngh? hng n?m. V??c xin u?n vn, b?ch h?u v ho g (Tdap)  Qu v? c th? c?n m?t li?u Td (b?ch h?u, u?n vn) nh?c l?i 10 n?m m?t l?n. V?c xin varicella (th?y ??u)  Qu v? c th? c?n v?c xin ny n?u ch?a ???c chch ng?a. V?c xin Zona  Qu v? c th? c?n v?c xin ny sau tu?i 60. V?c xin s?i, quai b? v rubella (MMR)  Qu v? c th? c?n t nh?t m?t li?u MMR n?u sinh ra vo n?m 1957 ho?c sau ?Sander Nephew v? c?ng c?n li?u th? hai. V?c xin lin h?p ph?  c?u khu?n (PCV13)  Qu v? c th? c?n v?c xin ny n?u c m?t s? tnh tr?ng b?nh l nh?t ??nh v tr??c ?y ch?a ???c chch ng?a. V?c xin ph? c?u khu?n polysaccharide (PPSV23)  Qu v? c th? c?n m?t ho?c hai li?u n?u qu v? ht thu?c ho?c n?u qu v? c m?t s? tnh tr?ng b?nh l nh?t ??nh. V?c xin lin h?p vim mng no (MenACWY)  Qu v? c th? c?n v?c xin ny n?u c cc tnh tr?ng nh?t ??nh. V?c xin vim gan A  Qu v? c th? c?n v?c xin ny n?u c m?t s? tnh tr?ng nh?t ??nh ho?c n?u qu v? ?i du l?ch ho?c lm vi?c ? nh?ng n?i m qu v? c th? b? ph?i nhi?m vim gan A. V?c xin vim gan B  Qu v? c th? c?n v?c xin ny n?u c m?t s? tnh tr?ng b?nh l nh?t ??nh ho?c n?u qu v? ?i du l?ch ho?c lm vi?c ? nh?ng n?i m qu v? c th? b? ph?i nhi?m vim gan B. V?c xin Haemophilus influenzae tup b (Hib)  Qu v? c th? c?n v?c xin ny n?u c cc tnh tr?ng nh?t ??nh. V?c xin Human papillomavirus (HPV)  N?u ???c chuyn gia ch?m [place] s?c kh?e khuy?n ngh?, qu v? c th? c?n ba li?u trong 6 thng. Qu v?  c th? ???c tim v?cxin d??i d?ng cc li?u ring l? ho?c nhi?u h?n m?t v?cxin ???c tim cng nhau trong m?t l?n tim (v?cxin k?t h?p). Hy trao ??i v?i chuyn gia ch?m [place] s?c kh?e c?a qu v? v? cc nguy c? v l?i ch c?a v?cxin k?t h?p. Ti c?n lm cc xt nghi?m no? Xt nghi?m mu  N?ng ?? lipid v cholesterol. L??ng cc ch?t ny c th? ???c ki?m tra 5 n?m m?t l?n ho?c th??ng xuyn h?n n?u qu v? trn 50 tu?i.  Xt nghi?m vim gan C.  Xt nghi?m vim gan B. Sng l?c  Sng l?c ung th? ph?i. Qu v? c th? ???c lm sng l?c ny m?i n?m m?t l?n b?t ??u ? tu?i 55 n?u qu v? c ti?n s? ht 30 bao thu?c m?i n?m v hi?n nay c ht thu?c ho?c ? b? thu?c trong vng 15 n?m tr??c.  Sng l?c ung th? ??i tr?c trng. T?t c? ng??i l?n nn sng l?c b?t ??u ? tu?i 50 v ti?p t?c cho ??n tu?i 75. Chuyn gia ch?m [place] s?c kh?e c?a qu v? c th? khuy?n ngh? sng l?c ? tu?i 45 n?u qu v? b? t?ng nguy c?. Qu v? s? ???c lm cc ki?m tra 1-10 n?m m?t l?n, ty thu?c vo k?t qu? v lo?i ki?m tra sng l?c c?a qu v?.  Sng l?c ti?u ???ng. Xt nghi?m ny ???c th?c hi?n b?ng cch ki?m tra ???ng huy?t (glucose) sau khi qu v? khng ?n g trong m?t kho?ng th?i gian (nh?n ?i). Qu v? c th? ???c lm xt nghi?m ny 1-3 n?m m?t l?n.  Ch?p x-quang tuy?n v. Ki?m tra ny c th? ???c th?c hi?n 1-2 n?m m?t l?n. Hy trao ??i v?i chuyn gia ch?m [place] s?c kh?e v? vi?c khi no qu v? nn b?t ??u ch?p x-quang tuy?n v hng n?m. ?i?u ny c th? ph? thu?c vo vi?c qu v? c ti?n s? gia ?nh b? ung th? v hay khng.  Sng l?c ung th? lin quan ??n BRCA. Xt nghi?m ny c th? ???c th?c hi?n n?u qu v? c ti?n s? gia ?nh  b? ung th? v, bu?ng tr?ng, ?ng d?n tr?ng ho?c ung th? phc m?c.  Khm vng ch?u v xt nghi?m ph?t t? bo c? t? cung (Pap). Nh?ng xt nghi?m ny c th? ???c th?c hi?n 3 n?m m?t l?n b?t ??u t? tu?i 21. B?t ??u ? tu?i 30, xt nghi?m ny c th? ???c th?c hi?n 5 n?m m?t l?n n?u qu v? ???c lm xt nghi?m Pap k?t h?p v?i  xt nghi?m HPV. Cc xt nghi?m khc  Ki?m tra b?nh ly truy?n qua ???ng tnh d?c (STD).  Ch?p m?t ?? x??ng. Ki?m tra ny ???c th?c hi?n ?? sng l?c b?nh long x??ng. Qu v? c th? ???c ch?p m?t ?? x??ng n?u c nguy c? cao b? long x??ng. Tun th? nh?ng h??ng d?n ny ? nh: ?n v u?ng  ?n ch? ?? ?n bao g?m tri cy v rau c? t??i, ng? c?c nguyn h?t, protein t? th?t n?c v s?a t bo.  Dng th?c ph?m b? sung vitamin v ch?t khong theo khuy?n ngh? c?a chuyn gia ch?m [place] s?c kh?e.  Khng u?ng r??u n?u: ? Chuyn gia ch?m [place] s?c kh?e c?a qu v? khuyn qu v? khng u?ng r??u. ? Qu v? c [place], c th? c [place], ho?c c k? ho?ch c [place].  N?u qu v? u?ng r??u: ? Gi?i h?n l??ng r??u qu v? u?ng ? 0-1 ly m?i ngy. ? Bi?t r m?t ly c bao nhiu r??u. ? M?, m?t ly t??ng ???ng v?i m?t chai bia 12 ao x? (355 mL), m?t ly r??u vang 5 ao x? (148 mL), ho?c m?t ly r??u m?nh 1 ao x? (44 mL). L?i s?ng  Ch?m [place] r?ng v l?i hng ngy.  N?ng v?n ??ng. T?p th? d?c t nh?t 30 pht t? 5 ngy tr? ln m?i tu?n.  Khng s? d?ng b?t k? s?n ph?m no c nicotine ho?c thu?c l, ch?ng ha?n nh? thu?c l d?ng ht, thu?c l ?i?n t? v thu?c l d?ng nhai. N?u qu v? c?n gip ?? ?? cai thu?c, hy h?i chuyn gia ch?m [place] s?c kh?e.  N?u qu v? c quan h? tnh d?c, hy th?c hnh quan h? tnh d?c an ton. Dng bao cao su ho?c hnh th?c ki?m sot sinh s?n khc (trnh [place]) ?? ng?n ng?a mang thai v STI (b?nh ly truy?n qua ???ng tnh d?c).  N?u chuyn gia ch?m [place] s?c kh?e ch? d?n, hy dng aspirin li?u th?p hng ngy b?t ??u t? 50 tu?i. C?n lm g ti?p theo?  ??n khm v?i chuyn gia ch?m [place] s?c kh?e m?t l?n m?i n?m ?? ki?m tra s?c kh?e.  Hy h?i chuyn gia ch?m [place] s?c kh?e v? vi?c bao lu th qu v? nn khm m?t v r?ng m?t l?n.  Tim v?c xin ??y ??Tera Mater tin ny khng nh?m m?c ?ch thay th? cho l?i khuyn m chuyn gia ch?m [place] s?c kh?e ni v?i qu v?. Hy b?o ??m qu v? ph?i th?o lu?n b?t k? v?n ?? g m qu v?  c v?i chuyn gia ch?m  s?c kh?e c?a qu v?. Document Released: 06/15/2016 Document Revised: 12/07/2017 Document Reviewed: 12/07/2017 Elsevier Patient Education  2020 [place] ng?a t?ng huy?t p Preventing Hypertension T?ng huy?t p, th??ng ???c g?i l huy?t p cao, l khi l?c b?m mu qua ??ng m?ch c?a qu v? qu m?nh. ??ng m?ch l cc m?ch mu mang mu t? tim ?i kh?p c? th?. Theo th?i gian, t?ng huy?t p c th? gy t?n th??ng  cc ??ng m?ch v gi?m l??ng mu ch?y ??n cc b? ph?n quan tr?ng c?a c? th?, bao g?m c? no, tim v th?n. Thng th??ng, t?ng huy?t p khng gy ra tri?u ch?ng cho ??n khi huy?t p r?t cao. V l do ny, ?i?u quan tr?ng l ki?m tra huy?t p c?a qu v? m?t cch th??ng xuyn. T?ng huy?t p th??ng c th? phng ng?a b?ng cch thay ??i ch? ?? ?n v l?i s?ng. N?u qu v? ? b? t?ng huy?t p, qu v? c th? ki?m sot n b?ng cch thay ??i ch? ?? ?n v l?i s?ng, c?ng nh? l dng thu?c. C th? th?c hi?n nh?ng thay ??i dinh d??ng no khc? Duy tr m?t ch? ?? ?n lnh m?nh. Vi?c ny bao g?m:  ?n t mu?i (natri) h?n. Hy h?i chuyn gia ch?m [place] s?c kh?e xem ?n bao nhiu natri th an ton cho qu v?. Khuy?n co chung l tiu th? t h?n 1 mu?ng c ph (2.300 mg) natri m?i ngy. ? (Khng) thm mu?i vo th?c ?n. ? Ch?n cc ph??ng n t natri khi ?i mua ?? v ?n ? ngoi.  Gi?i h?n l??ng ch?t bo trong ch? ?? ?n c?a qu v?. Qu v? c th? lm ?i?u ny b?ng cch ?n cc s?n ph?m s?a t bo ho?c khng bo v b?ng cch ?n t th?t ?? h?n.  ?n nhi?u h?n tri cy, rau c? v ng? c?c nguyn cm. ??t m?c tiu s? ?n: ? 1-2 c?c tri cy v rau c? t??i m?i ngy. ? 3-4 kh?u ph?n ng? c?c nguyn cm m?i ngy.  Hessie Diener cc th?c ph?m v ?? u?ng c thm ???ng.  ?n c c ch?a ch?t bo t?t cho s?c kh?e (axit bo omega-3), ch?ng h?n nh? c thu ho?c c h?i. N?u qu v? c?n gip l?p m?t k? ho?ch ?n u?ng lnh m?nh, hy th? ch? ?? ?n DASH. Ch? ?? ?n ny c nhi?u tri cy, rau c? v ng? c?c nguyn cm. N  t mu?i, th?t ?? v t b? sung ???ng. DASH l vi?t t?t c?a Dietary Approaches to Stop Hypertension, ngh?a l Ph??ng php ti?p c?n ch? ?? ?n u?ng ?? lm gi?m huy?t p. C th? th?c hi?n nh?ng thay ??i no v? l?i s?ng?   Gi?m cn n?u qu v? th?a cn. Ch? gi?m 3?5% cn n?ng c? th? c th? gip ng?n ng?a ho?c ki?m sot t?ng huy?t p. ? V d?: n?u cn n?ng hi?n t?i c?a qu v? l 200 lb (91 kg), gi?m 3-5% cn n?ng c ngh?a l gi?m 6-10 lb (2,7-4,5 kg). ? Hy yu c?u chuyn gia ch?m [place] s?c kh?e gip qu v? l?p m?t k? ho?ch t?p th? d?c v ch? ?? ?n ?? gi?m cn an ton.  T?p th? d?c ??y ??. Th??c hi?n i?t nh?t 150 phu?t t?p th? du?c v?i c???ng ?? trung bi?nh m?i tu?n. ? Qu v? c th? lm ?i?u ny trong cc phin t?p ng?n, vi l?n m?i ngy ho?c c th? t?p nh?ng phin t?p di h?n m?t vi l?n m?i tu?n. V d?: qu v? c th? ?i b? nhanh ho?c ??p xe trong 10 pht, 3 l?n m?i ngy, trong 5 ngy m?i tu?n.  Tm cch gi?m c?ng th?ng, ch?ng h?n nh? t?p th? d?c, thi?n, nghe nh?c ho?c tham gia l?p yoga. N?u qu v? c?n gip ?? ?? gi?m c?ng th?ng, hy h?i chuyn gia ch?m [place] s?c kh?e.  Khng ht thu?c. ?i?u ny bao g?m c? thu?c l ?i?n t?.  Cc ch?t ha h?c trong thu?c l v s?n ph?m ch?a nicotine lm t?ng huy?t p m?i l?n qu v? ht thu?c. N?u qu v? c?n gip ?? ?? cai thu?c, hy h?i chuyn gia ch?m [place] s?c kh?e.  Trnh u?ng r??u. N?u qu v? u?ng r??u, hy gi?i h?n l??ng r??u qu v? u?ng ? m?c khng qu 1 ly m?i ngy v?i ph? n? khng mang thai v 2 ly m?i ngy v?i nam gi?i. M?t ly t??ng ???ng v?i 12 ao-x? bia, 5 ao-x? r??u vang, ho?c 1 ao-x? r??u m?nh. T?i sao nh?ng thay ??i ny l?i quan tr?ng? Thay ??i ch? ?? ?n v l?i s?ng c th? gip qu v? ng?n ng?a t?ng huy?t p v c th? khi?n qu v? c?m th?y m?i th? t?t h?n v c?i thi?n ch?t l??ng cu?c s?ng c?a qu v?. N?u qu v? b? t?ng huy?t p, th?c hi?n nh?ng thay ??i ny s? gip qu v? ki?m sot b?nh v gip ng?n ng?a cc bi?n ch?ng ?ng k?, ch?ng h?n nh?:  C?ng v h?p  ??ng m?ch cung c?p mu ??n: ? Tim qu v?. ?i?u ny c th? gy nh?i mu c? tim. ? No qu v?. ?i?u ny c th? gy ??t qu?. ? Th?n qu v?. ?i?u ny c th? gy suy th?n.  C?ng th?ng trn c? tim, c th? gy suy tim. Ti c th? lm g ?? gi?m nguy c??   Hy lm vi?c v?i chuyn gia ch?m [place] s?c kh?e ?? l?p m?t k? ho?ch phng ng?a t?ng huy?t p hi?u qu? v?i qu v?. Lm theo k? ho?ch c?a b?n thn v tun th? t?t c? cc cu?c h?n khm l?i theo ch? d?n c?a chuyn gia ch?m [place] s?c kh?e.  Tm cch ki?m tra huy?t p t?i nh. ??m b?o r?ng qu v? bi?t huy?t p m?c tiu c?a mnh, theo l?i c?a chuyn gia ch?m [place] s?c kh?e. Tnh tr?ng ny ???c ?i?u tr? nh? th? no? Ngoi thay ??i ch? ?? ?n v l?i s?ng, chuyn gia ch?m [place] s?c kh?e c th? khuy?n co qu v? dng thu?c gip h? huy?t p c?a qu v?. Qu v? c th? c?n th? nhi?u thu?c khc nhau ?? tm ra lo?i c tc d?ng t?t nh?t v?i mnh. Qu v? c?ng c th? c?n dng nhi?u h?n m?t lo?i thu?c. Ch? s? d?ng thu?c khng k ??n v thu?c k ??n theo ch? d?n c?a chuyn gia ch?m [place] s?c kh?e. Tm s? h? tr? ? ?u Chuyn gia ch?m [place] s?c kh?e c th? gip qu v? phng ng?a t?ng huy?t p v gip qu v? gi? huy?t p ? m?c kh?e m?nh. B?nh vi?n t?i ??a ph??ng ho?c c?ng ??ng c?a qu v? c?ng c th? cung c?p cc d?ch v? h? tr? v ch??ng trnh phng ng?a. Hi?p h?i [place] cung c?p m?ng h? tr? tr?c tuy?n t?i: [email or web address] N?i ?? tm thm thng tin Tm hi?u thm v? t?ng huy?t p t?:  Vi?n Tim, Ph?i v Mu Qu?c gia: [email or web address]  [place] tm Ki?m sot v Phng ng?a D?ch b?nh: [email or web address]  Vi?n hn lm Bc s? Cassell Smiles ?[place]?: [email or web address] Tm hi?u thm v? ch? ?? ?n DASH t?:  Vi?n Tim, Ph?i v Mu Qu?c gia: [email or web address] Hy lin l?c v?i chuyn gia ch?m [place] s?c kh?e  n?u:  Qu v? ngh? qu v? c ph?n ?ng v?i thu?c ? dng.  Qu v? b? ?au ??u ho?c c?m th?y chng m?t ti di?n.  Qu v? b? s?ng ph ?  m?t c chn.  Qu v? c v?n ?? v? th? l?c. Tm t?t  T?ng huy?t p th??ng khng gy ra b?t k? tri?u ch?ng no cho ??n khi huy?t p r?t cao. ?i?u quan tr?ng l ki?m tra huy?t p th??ng xuyn.  Thay ??i ch? ?? ?n v l?i s?ng l nh?ng b??c quan tr?ng nh?t trong vi?c phng ng?a t?ng huy?t p.  B?ng cch gi? cho huy?t p n?m trong ph?m vi kh?e m?nh, qu v? c th? phng ng?a cc bi?n ch?ng nh? nh?i mu c? tim, suy tim, ??t qu? v suy th?n.  Hy lm vi?c v?i chuyn gia ch?m [place] s?c kh?e ?? l?p m?t k? ho?ch phng ng?a t?ng huy?t p hi?u qu? v?i qu v?. Thng tin ny khng nh?m m?c ?ch thay th? cho l?i khuyn m chuyn gia ch?m [place] s?c kh?e ni v?i qu v?. Hy b?o ??m qu v? ph?i th?o lu?n b?t k? v?n ?? g m qu v? c v?i chuyn gia ch?m  s?c kh?e c?a qu v?. Document Released: 06/15/2016 Document Revised: 06/15/2016 Elsevier Patient Education  [employer].

## 2019-02-04 NOTE — Progress Notes (Signed)
11/24/20208:12 AM  Margaret Flynn 11/26/1970, 48 y.o., female [id number]  Chief Complaint  Patient presents with  . Annual Exam    HPI:   Patient is a 48 y.o. female with past medical history significant for hypothyroidism, mild intermittent asthma who presents today for CPE  Last CPE Nov 2019 Sister serves as interpreter  G2P1 Menses: amenorrhea since 2017 (when she was dx with hypothyroidism), no vasomotor sx, no vaginal dryness, no mood changes Cervical Cancer Screening: last pap 2017, denies h/o abnormal pap Breast Cancer Screening: at age 40, no fhx breast or ovarian cancer Colorectal Cancer Screening: at age 38, no fhx colon cancer Bone Density Testing: at age 33 HIV Screening: 2017 STI Screening: 2016, declines Seasonal Influenza Vaccination: today Td/Tdap Vaccination: 2017 Pneumococcal Vaccination: 2018 Zoster Vaccination: at age 29 Frequency of Dental evaluation: Q6 months Frequency of Eye evaluation: yearly, wears eyeglasses Exercise: no Diet: balanced diet, home cooked  Sees Dr Talmage Nap for her hypothyroidism Asthma mild intermittent, has not needed an inhaler in years   Hearing Screening   125Hz  250Hz  500Hz  1000Hz  2000Hz  3000Hz  4000Hz  6000Hz  8000Hz   Right ear:           Left ear:           Vision Screening Comments: Did not bring glasses   Depression screen [facility] 2/9 02/04/2019 02/01/2018 11/16/2016  Decreased Interest 0 0 0  Down, Depressed, Hopeless 0 0 0  PHQ - 2 Score 0 0 0    Fall Risk  02/04/2019 02/01/2018 11/16/2016 06/17/2016 11/11/2015  Falls in the past year? 0 0 No No No  Number falls in past yr: 0 0 - - -  Injury with Fall? 0 0 - - -     No Known Allergies  Prior to Admission medications   Medication Sig Start Date End Date Taking? Authorizing Provider  levothyroxine (SYNTHROID, LEVOTHROID) 75 MCG tablet Take 75 mcg by mouth daily before breakfast.   Yes [provider]    Past Medical History:  Diagnosis Date  . Allergy   .  Asthma   . Thyroid disease     Past Surgical History:  Procedure Laterality Date  . CESAREAN SECTION      Social History   Tobacco Use  . Smoking status: Never Smoker  . Smokeless tobacco: Never Used  Substance Use Topics  . Alcohol use: Not on file    No family history on file.  Review of Systems  Constitutional: Negative for chills and fever.  Respiratory: Negative for cough and shortness of breath.   Cardiovascular: Negative for chest pain, palpitations and leg swelling.  Gastrointestinal: Negative for abdominal pain, blood in stool, constipation, diarrhea, melena, nausea and vomiting.  Genitourinary: Negative for dysuria, frequency, hematuria and urgency.  Neurological: Negative for dizziness and headaches.  Psychiatric/Behavioral: Negative for depression. The patient is not nervous/anxious and does not have insomnia.   All other systems reviewed and are negative.    OBJECTIVE:  Today's Vitals   02/04/19 0806 02/04/19 0845  BP: (!) 155/87 (!) 146/80  Pulse: 63   Temp: 98.8 F (37.1 C)   SpO2: 100%   Weight: 125 lb 3.2 oz (56.8 kg)   Height: 4' 11.45" (1.51 m)    Body mass index is 24.91 kg/m.  Wt Readings from Last 3 Encounters:  02/04/19 125 lb 3.2 oz (56.8 kg)  02/01/18 122 lb (55.3 kg)  11/16/16 121 lb (54.9 kg)    Physical Exam Vitals signs and nursing note reviewed.  Exam conducted with a chaperone present.  Constitutional:      Appearance: She is well-developed.  HENT:     Head: Normocephalic and atraumatic.     Right Ear: Hearing, tympanic membrane, ear canal and external ear normal.     Left Ear: Hearing, tympanic membrane, ear canal and external ear normal.  Eyes:     Conjunctiva/sclera: Conjunctivae normal.     Pupils: Pupils are equal, round, and reactive to light.  Neck:     Musculoskeletal: Neck supple.     Thyroid: No thyromegaly.  Cardiovascular:     Rate and Rhythm: Normal rate and regular rhythm.     Pulses: Normal pulses.      Heart sounds: Normal heart sounds. No murmur. No friction rub. No gallop.   Pulmonary:     Effort: Pulmonary effort is normal.     Breath sounds: Normal breath sounds. No wheezing, rhonchi or rales.  Chest:     Breasts: Breasts are symmetrical.        Right: No inverted nipple, mass, nipple discharge, skin change or tenderness.        Left: No inverted nipple, mass, nipple discharge, skin change or tenderness.  Abdominal:     General: Bowel sounds are normal. There is no distension.     Palpations: Abdomen is soft. There is no hepatomegaly, splenomegaly or mass.     Tenderness: There is no abdominal tenderness. There is no guarding.     Hernia: No hernia is present.  Genitourinary:    Labia:        Right: No rash or lesion.        Left: No rash or lesion.      Vagina: No vaginal discharge or erythema.     Cervix: No cervical motion tenderness, discharge or friability.     Uterus: Not enlarged, not fixed and not tender.      Adnexa:        Right: No mass or tenderness.         Left: No mass or tenderness.    Musculoskeletal: Normal range of motion.     Right lower leg: No edema.     Left lower leg: No edema.  Lymphadenopathy:     Cervical: No cervical adenopathy.     Upper Body:     Right upper body: No supraclavicular, axillary or pectoral adenopathy.     Left upper body: No supraclavicular, axillary or pectoral adenopathy.  Skin:    General: Skin is warm and dry.  Neurological:     General: No focal deficit present.     Mental Status: She is alert and oriented to person, place, and time.     Cranial Nerves: No cranial nerve deficit.     Gait: Gait normal.     Deep Tendon Reflexes: Reflexes are normal and symmetric.  Psychiatric:        Mood and Affect: Mood normal.        Behavior: Behavior normal.     No results found for this or any previous visit (from the past 24 hour(s)).  No results found.   ASSESSMENT and PLAN  1. Women's annual routine gynecological  examination Routine HCM labs ordered. HCM reviewed/discussed. Anticipatory guidance regarding healthy weight, lifestyle and choices given.  - Cytology - PAP([place])  2. Elevated BP without diagnosis of hypertension Discussed LFM, recheck at next OV. RTC precautions reviewed.  3. Need for prophylactic vaccination and inoculation against influenza - Flu Vaccine  4. Screening for metabolic disorder - CMET with GFR  5. Screening for lipid disorders - Lipid panel  6. Screening for deficiency anemia - CBC  Return in about 3 months (around 05/07/2019) for blood pressure.    Rutherford Guys, MD [facility] [place], [place] [address] Ph.  [phone] Fax [phone]

## 2019-02-05 LAB — CMP14+EGFR
ALT: 22 IU/L (ref 0–32)
AST: 24 IU/L (ref 0–40)
Albumin/Globulin Ratio: 1.5 (ref 1.2–2.2)
Albumin: 4.7 g/dL (ref 3.8–4.8)
Alkaline Phosphatase: 122 IU/L — ABNORMAL HIGH (ref 39–117)
BUN/Creatinine Ratio: 16 (ref 9–23)
BUN: 11 mg/dL (ref 6–24)
Bilirubin Total: 0.6 mg/dL (ref 0.0–1.2)
CO2: 22 mmol/L (ref 20–29)
Calcium: 9.8 mg/dL (ref 8.7–10.2)
Chloride: 105 mmol/L (ref 96–106)
Creatinine, Ser: 0.69 mg/dL (ref 0.57–1.00)
GFR calc Af Amer: 119 mL/min/{1.73_m2} (ref >59)
GFR calc non Af Amer: 103 mL/min/{1.73_m2} (ref >59)
Globulin, Total: 3.2 g/dL (ref 1.5–4.5)
Glucose: 93 mg/dL (ref 65–99)
Potassium: 4.9 mmol/L (ref 3.5–5.2)
Sodium: 144 mmol/L (ref 134–144)
Total Protein: 7.9 g/dL (ref 6.0–8.5)

## 2019-02-05 LAB — CBC
Hematocrit: 40.4 % (ref 34.0–46.6)
Hemoglobin: 12.6 g/dL (ref 11.1–15.9)
MCH: 26.9 pg (ref 26.6–33.0)
MCHC: 31.2 g/dL — ABNORMAL LOW (ref 31.5–35.7)
MCV: 86 fL (ref 79–97)
Platelets: 277 10*3/uL (ref 150–450)
RBC: 4.68 x10E6/uL (ref 3.77–5.28)
RDW: 13.2 % (ref 11.7–15.4)
WBC: 7.1 10*3/uL (ref 3.4–10.8)

## 2019-02-05 LAB — LIPID PANEL
Chol/HDL Ratio: 3.1 ratio (ref 0.0–4.4)
Cholesterol, Total: 226 mg/dL — ABNORMAL HIGH (ref 100–199)
HDL: 72 mg/dL (ref >39)
LDL Chol Calc (NIH): 136 mg/dL — ABNORMAL HIGH (ref 0–99)
Triglycerides: 105 mg/dL (ref 0–149)
VLDL Cholesterol Cal: 18 mg/dL (ref 5–40)

## 2019-02-10 LAB — CYTOLOGY - PAP: Diagnosis: NEGATIVE

## 2019-02-13 ENCOUNTER — Encounter: Payer: BLUE CROSS/BLUE SHIELD | Admitting: Family Medicine

## 2019-05-08 ENCOUNTER — Ambulatory Visit: Payer: BC Managed Care – PPO | Admitting: Family Medicine

## 2019-10-30 DIAGNOSIS — E890 Postprocedural hypothyroidism: Secondary | ICD-10-CM | POA: Diagnosis not present

## 2019-11-06 DIAGNOSIS — E890 Postprocedural hypothyroidism: Secondary | ICD-10-CM | POA: Diagnosis not present

## 2019-12-18 DIAGNOSIS — E890 Postprocedural hypothyroidism: Secondary | ICD-10-CM | POA: Diagnosis not present

## 2020-01-21 ENCOUNTER — Encounter: Payer: BC Managed Care – PPO | Admitting: Registered Nurse

## 2020-01-23 ENCOUNTER — Encounter: Payer: BC Managed Care – PPO | Admitting: Registered Nurse

## 2020-02-06 ENCOUNTER — Encounter: Payer: BC Managed Care – PPO | Admitting: Family Medicine

## 2020-06-08 ENCOUNTER — Other Ambulatory Visit (HOSPITAL_COMMUNITY)
Admission: RE | Admit: 2020-06-08 | Discharge: 2020-06-08 | Disposition: A | Discharge status: Home / Self Care | Payer: BC Managed Care – PPO | Source: Ambulatory Visit | Attending: Family Medicine | Admitting: Family Medicine

## 2020-06-08 DIAGNOSIS — Z13220 Encounter for screening for lipoid disorders: Secondary | ICD-10-CM | POA: Diagnosis not present

## 2020-06-08 DIAGNOSIS — Z124 Encounter for screening for malignant neoplasm of cervix: Secondary | ICD-10-CM | POA: Insufficient documentation

## 2020-06-08 DIAGNOSIS — Z0000 Encounter for general adult medical examination without abnormal findings: Secondary | ICD-10-CM | POA: Diagnosis not present

## 2020-06-09 DIAGNOSIS — Z1211 Encounter for screening for malignant neoplasm of colon: Secondary | ICD-10-CM | POA: Diagnosis not present

## 2020-06-10 LAB — CYTOLOGY - PAP
Comment: NEGATIVE
Diagnosis: NEGATIVE
High risk HPV: NEGATIVE

## 2020-06-22 ENCOUNTER — Other Ambulatory Visit: Payer: Self-pay | Admitting: Family Medicine

## 2020-06-22 DIAGNOSIS — Z1231 Encounter for screening mammogram for malignant neoplasm of breast: Secondary | ICD-10-CM

## 2020-07-11 DIAGNOSIS — J45909 Unspecified asthma, uncomplicated: Secondary | ICD-10-CM | POA: Diagnosis not present

## 2020-11-01 DIAGNOSIS — E890 Postprocedural hypothyroidism: Secondary | ICD-10-CM | POA: Diagnosis not present

## 2020-11-08 DIAGNOSIS — E890 Postprocedural hypothyroidism: Secondary | ICD-10-CM | POA: Diagnosis not present

## 2021-06-16 DIAGNOSIS — E7800 Pure hypercholesterolemia, unspecified: Secondary | ICD-10-CM | POA: Diagnosis not present

## 2021-06-16 DIAGNOSIS — Z0000 Encounter for general adult medical examination without abnormal findings: Secondary | ICD-10-CM | POA: Diagnosis not present

## 2021-09-05 DIAGNOSIS — Z7185 Encounter for immunization safety counseling: Secondary | ICD-10-CM | POA: Diagnosis not present

## 2021-09-05 DIAGNOSIS — E782 Mixed hyperlipidemia: Secondary | ICD-10-CM | POA: Diagnosis not present

## 2021-09-05 DIAGNOSIS — Z23 Encounter for immunization: Secondary | ICD-10-CM | POA: Diagnosis not present

## 2021-09-06 DIAGNOSIS — Z1211 Encounter for screening for malignant neoplasm of colon: Secondary | ICD-10-CM | POA: Diagnosis not present

## 2021-11-01 DIAGNOSIS — E890 Postprocedural hypothyroidism: Secondary | ICD-10-CM | POA: Diagnosis not present

## 2021-11-08 DIAGNOSIS — E890 Postprocedural hypothyroidism: Secondary | ICD-10-CM | POA: Diagnosis not present

## 2021-12-07 DIAGNOSIS — Z23 Encounter for immunization: Secondary | ICD-10-CM | POA: Diagnosis not present

## 2022-01-09 DIAGNOSIS — E890 Postprocedural hypothyroidism: Secondary | ICD-10-CM | POA: Diagnosis not present

## 2022-03-14 DIAGNOSIS — E890 Postprocedural hypothyroidism: Secondary | ICD-10-CM | POA: Diagnosis not present

## 2022-07-18 DIAGNOSIS — J4520 Mild intermittent asthma, uncomplicated: Secondary | ICD-10-CM | POA: Diagnosis not present

## 2022-07-18 DIAGNOSIS — Z13220 Encounter for screening for lipoid disorders: Secondary | ICD-10-CM | POA: Diagnosis not present

## 2022-07-18 DIAGNOSIS — Z1159 Encounter for screening for other viral diseases: Secondary | ICD-10-CM | POA: Diagnosis not present

## 2022-07-18 DIAGNOSIS — Z0000 Encounter for general adult medical examination without abnormal findings: Secondary | ICD-10-CM | POA: Diagnosis not present

## 2022-07-19 DIAGNOSIS — Z1211 Encounter for screening for malignant neoplasm of colon: Secondary | ICD-10-CM | POA: Diagnosis not present

## 2022-11-09 DIAGNOSIS — E890 Postprocedural hypothyroidism: Secondary | ICD-10-CM | POA: Diagnosis not present

## 2022-11-16 DIAGNOSIS — E890 Postprocedural hypothyroidism: Secondary | ICD-10-CM | POA: Diagnosis not present

## 2023-01-16 DIAGNOSIS — E890 Postprocedural hypothyroidism: Secondary | ICD-10-CM | POA: Diagnosis not present

## 2023-08-14 DIAGNOSIS — Z1211 Encounter for screening for malignant neoplasm of colon: Secondary | ICD-10-CM | POA: Diagnosis not present

## 2023-08-14 DIAGNOSIS — E782 Mixed hyperlipidemia: Secondary | ICD-10-CM | POA: Diagnosis not present

## 2023-08-14 DIAGNOSIS — J4520 Mild intermittent asthma, uncomplicated: Secondary | ICD-10-CM | POA: Diagnosis not present

## 2023-08-14 DIAGNOSIS — E039 Hypothyroidism, unspecified: Secondary | ICD-10-CM | POA: Diagnosis not present

## 2023-08-14 DIAGNOSIS — Z0000 Encounter for general adult medical examination without abnormal findings: Secondary | ICD-10-CM | POA: Diagnosis not present

## 2023-10-17 DIAGNOSIS — E7800 Pure hypercholesterolemia, unspecified: Secondary | ICD-10-CM | POA: Diagnosis not present

## 2023-11-15 DIAGNOSIS — E890 Postprocedural hypothyroidism: Secondary | ICD-10-CM | POA: Diagnosis not present

## 2023-11-22 DIAGNOSIS — E890 Postprocedural hypothyroidism: Secondary | ICD-10-CM | POA: Diagnosis not present
